# Patient Record
Sex: Male | Born: 1975 | Race: White | Hispanic: No | Marital: Married | Smoking: Never smoker
Health system: Southern US, Community
[De-identification: ages and names within clinical notes are randomized; demographics above are authoritative.]

## PROBLEM LIST (undated history)

## (undated) DIAGNOSIS — I4949 Other premature depolarization: Secondary | ICD-10-CM

## (undated) DIAGNOSIS — I429 Cardiomyopathy, unspecified: Secondary | ICD-10-CM

## (undated) HISTORY — DX: Cardiomyopathy, unspecified: I42.9

## (undated) HISTORY — DX: Other premature depolarization: I49.49

---

## 2001-04-11 ENCOUNTER — Emergency Department (HOSPITAL_COMMUNITY): Admission: EM | Admit: 2001-04-11 | Discharge: 2001-04-11 | Payer: Self-pay | Admitting: Emergency Medicine

## 2003-11-08 ENCOUNTER — Emergency Department (HOSPITAL_COMMUNITY): Admission: EM | Admit: 2003-11-08 | Discharge: 2003-11-08 | Payer: Self-pay | Admitting: Emergency Medicine

## 2004-04-02 ENCOUNTER — Encounter: Payer: Self-pay | Admitting: Cardiology

## 2004-04-02 ENCOUNTER — Emergency Department (HOSPITAL_COMMUNITY): Admission: EM | Admit: 2004-04-02 | Discharge: 2004-04-02 | Payer: Self-pay

## 2004-04-03 ENCOUNTER — Encounter: Payer: Self-pay | Admitting: Cardiology

## 2004-04-03 ENCOUNTER — Inpatient Hospital Stay (HOSPITAL_COMMUNITY): Admission: EM | Admit: 2004-04-03 | Discharge: 2004-04-08 | Payer: Self-pay | Admitting: Emergency Medicine

## 2004-04-06 ENCOUNTER — Encounter: Payer: Self-pay | Admitting: Cardiology

## 2005-06-23 ENCOUNTER — Emergency Department (HOSPITAL_COMMUNITY): Admission: EM | Admit: 2005-06-23 | Discharge: 2005-06-23 | Payer: Self-pay | Admitting: Emergency Medicine

## 2005-07-13 ENCOUNTER — Ambulatory Visit: Payer: Self-pay | Admitting: Cardiology

## 2005-07-20 ENCOUNTER — Ambulatory Visit: Payer: Self-pay

## 2005-10-05 ENCOUNTER — Ambulatory Visit: Payer: Self-pay | Admitting: Cardiology

## 2005-12-07 ENCOUNTER — Emergency Department (HOSPITAL_COMMUNITY): Admission: EM | Admit: 2005-12-07 | Discharge: 2005-12-07 | Payer: Self-pay | Admitting: Emergency Medicine

## 2006-10-24 IMAGING — CT CT PELVIS W/O CM
2 of 4 series · 17 of 46 positions shown, 19 images · IV contrast (agent unspecified)
Comparison: No previous films for comparison.

CLINICAL DATA: Left groin and back pain for one day.  Nausea.  No hematuria.  No previous abdominal surgery of history of carcinoma.  
 ABDOMEN CT WITHOUT CONTRAST:
TECHNIQUE: Multidetector CT imaging of the abdomen was performed following the standard protocol without IV contrast.
TECHNIQUE: Multidetector CT imaging of the pelvis was performed following the standard protocol without IV contrast.

[Series 2: abd/pelv w/o 5.0 b31f st · axial · non-contrast · 0.78mm/px · z∈[-528,-108]mm · 14 of 92 slices shown, 16 images]
[im 4/92  soft-tissue]
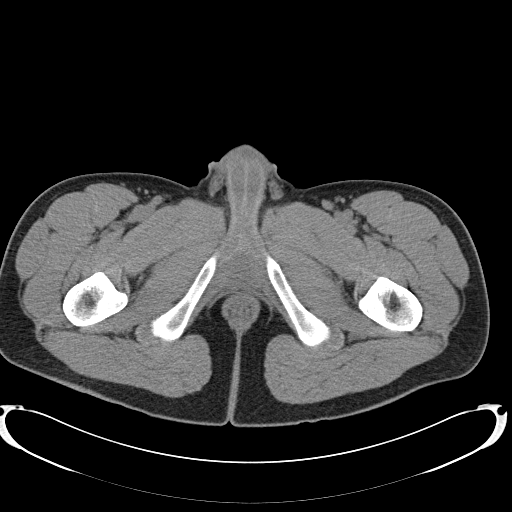
[im 4/92  bone]
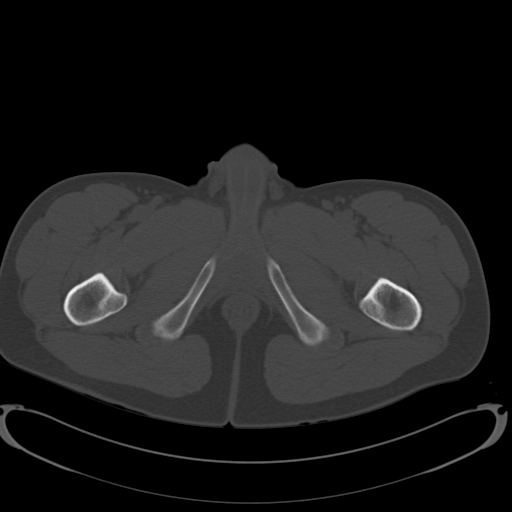
[im 12/92  soft-tissue]
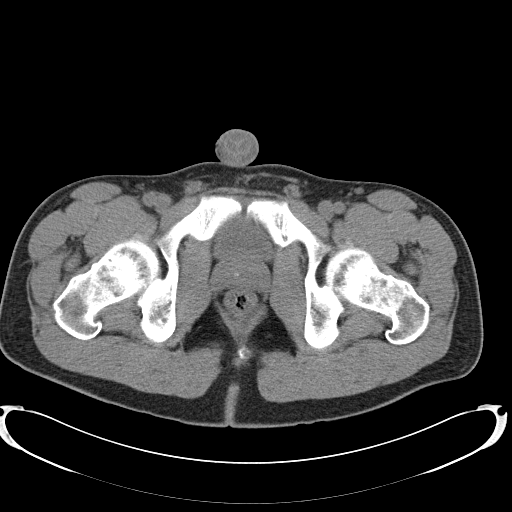
[im 19/92  soft-tissue]
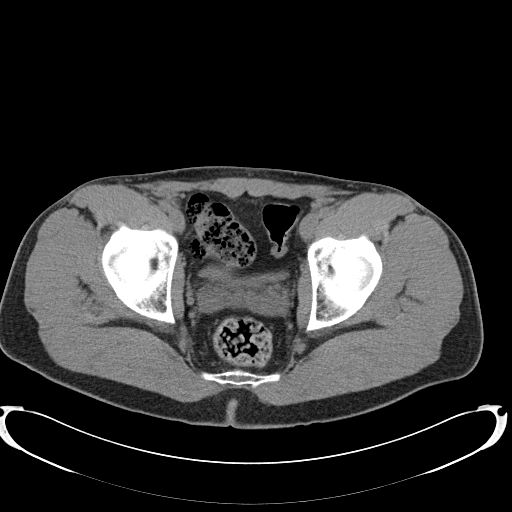
[im 23/92  soft-tissue]
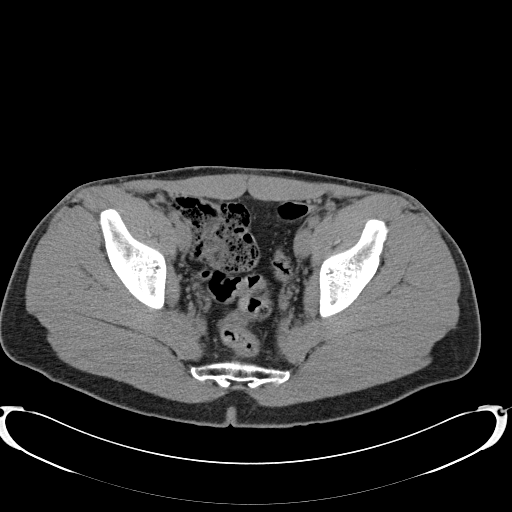
[im 31/92  soft-tissue]
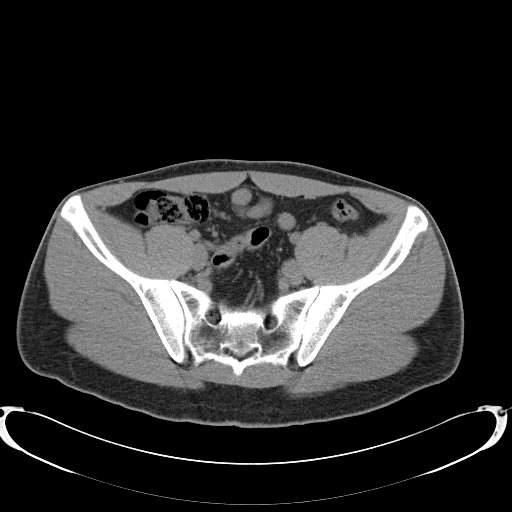
[im 38/92  soft-tissue]
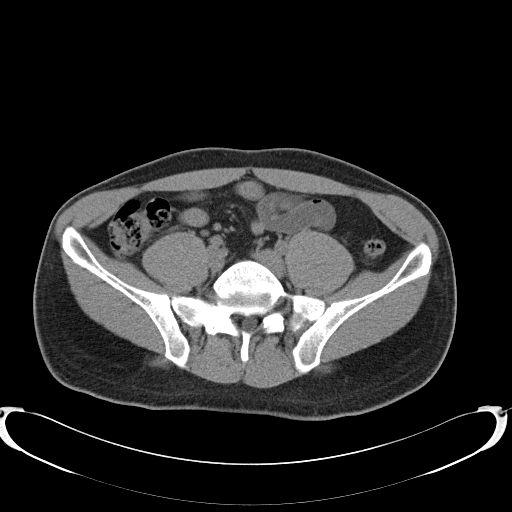
[im 42/92  soft-tissue]
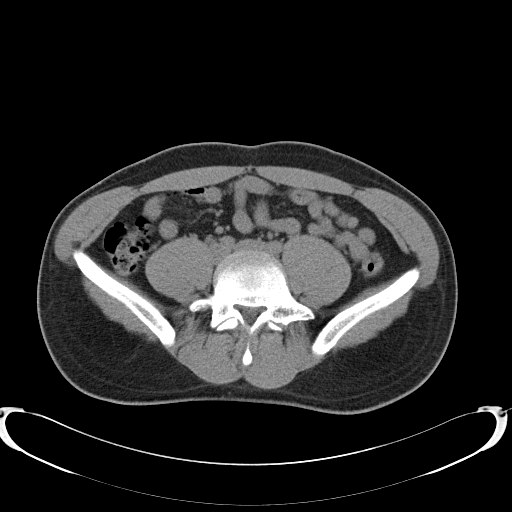
[im 50/92  soft-tissue]
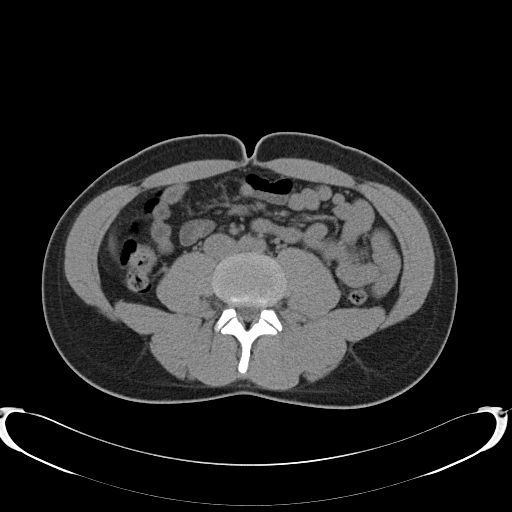
[im 54/92  soft-tissue]
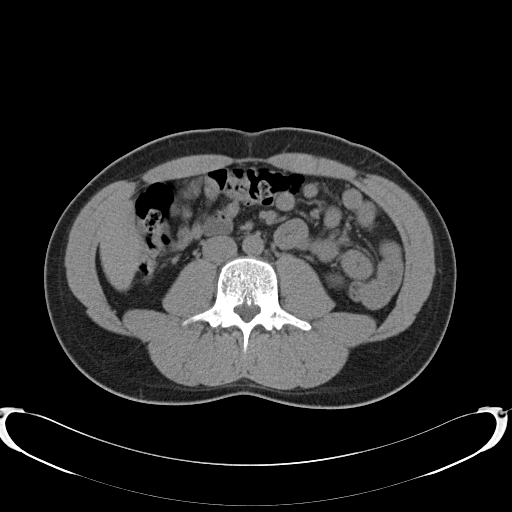
[im 54/92  bone]
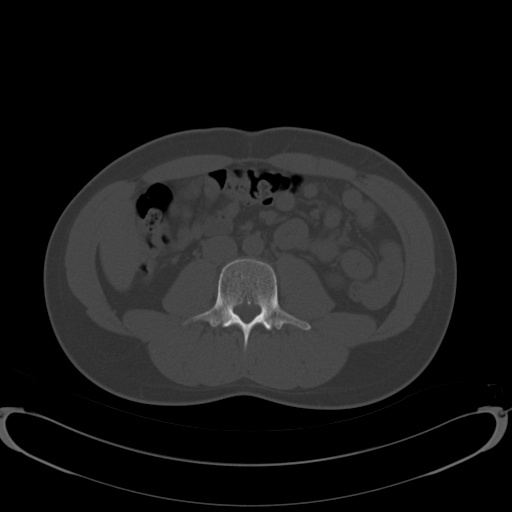
[im 61/92  soft-tissue]
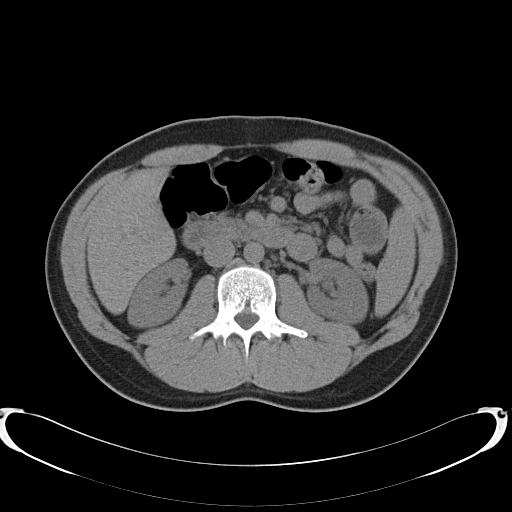
[im 69/92  soft-tissue]
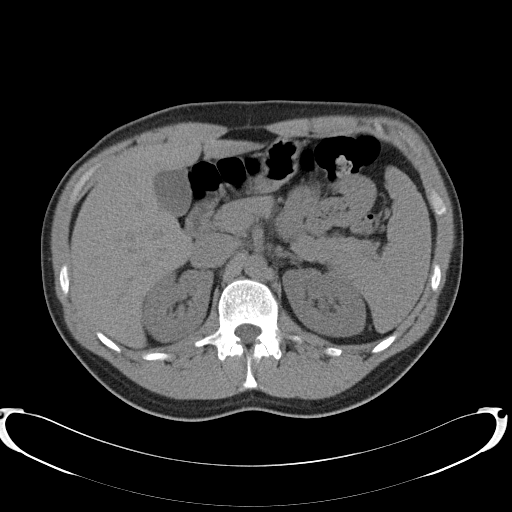
[im 73/92  soft-tissue]
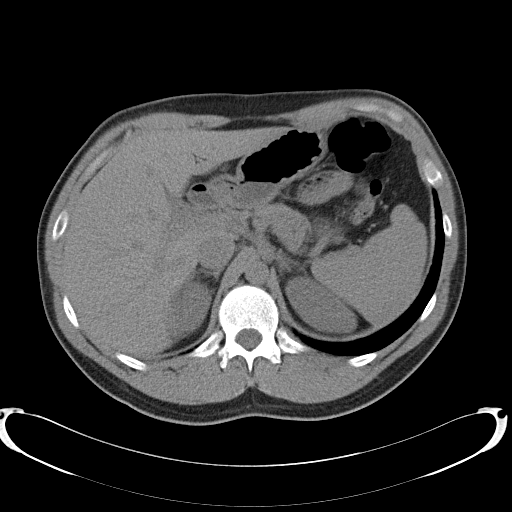
[im 80/92  soft-tissue]
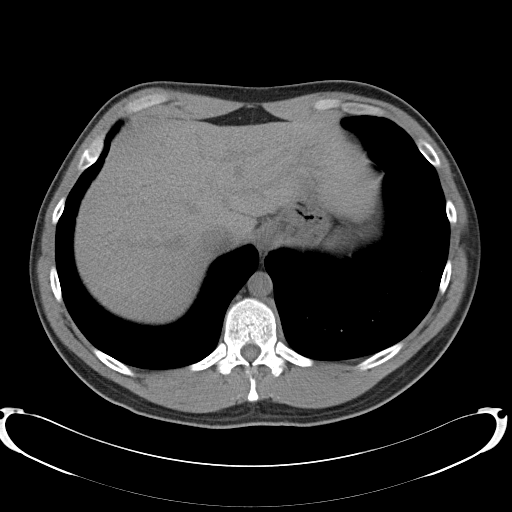
[im 88/92  soft-tissue]
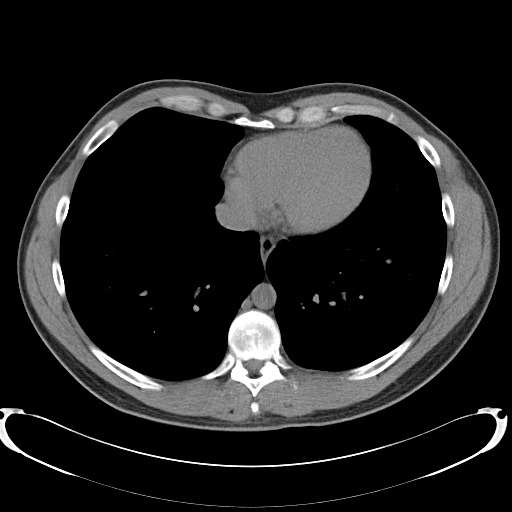

[Series 602: cor thins abd/pel · coronal · 0.89mm/px · 3 of 50 slices shown]
[im 17/50  soft-tissue]
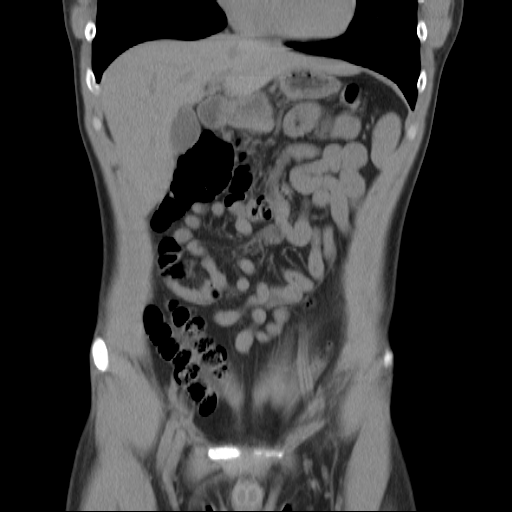
[im 22/50  soft-tissue]
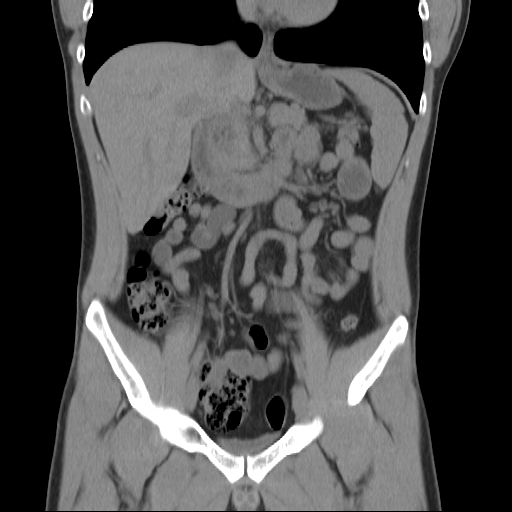
[im 28/50  soft-tissue]
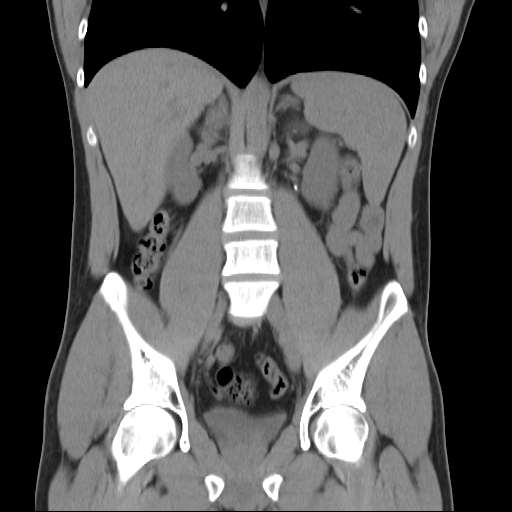

[17 of 46 positions shown; findings below may reference images not displayed]

FINDINGS: Scans show mild dilatation of the left renal collecting system and there is a partially obstructing 2 x 3 mm calculus in the region of the left ureteropelvic junction.  There are no stones seen within either kidney.  The lower lung fields, gallbladder, liver, common bile duct, pancreas, and spleen are normal.  The adrenal glands are normal.  There is no free fluid or air within the abdomen.  The bowel is not obstructed.  The bones of the lower thorax and upper lumbar spine are normal.
IMPRESSION: Partially obstructing 2 x 3 mm calculus left ureteropelvic junction.  Otherwise normal CT scan of the abdomen without contrast. 
 PELVIS CT WITHOUT CONTRAST:
FINDINGS: Scans show no evidence of obstruction of the distal ureters.  The bladder, prostate, and seminal vesicles are normal except for a single small calcification associated with the right prostate.  No free fluid or air is seen within the pelvis.  There is no obstruction of the bowel. There is a moderate amount of fecal material in the cecum.  The appendix is retrocecal in position and appears normal.  The bones of the pelvis are normal.  There are no significant changes associated with the lower lumbar spine.
IMPRESSION: Normal CT scan of the pelvis without contrast.

## 2007-01-13 ENCOUNTER — Ambulatory Visit: Payer: Self-pay | Admitting: Cardiology

## 2007-01-18 ENCOUNTER — Encounter: Payer: Self-pay | Admitting: Cardiology

## 2007-02-03 ENCOUNTER — Ambulatory Visit: Payer: Self-pay | Admitting: Internal Medicine

## 2007-05-18 ENCOUNTER — Ambulatory Visit: Payer: Self-pay | Admitting: Cardiology

## 2008-03-18 ENCOUNTER — Ambulatory Visit: Payer: Self-pay | Admitting: Cardiology

## 2009-01-31 ENCOUNTER — Ambulatory Visit: Payer: Self-pay | Admitting: Cardiology

## 2009-07-02 DIAGNOSIS — I429 Cardiomyopathy, unspecified: Secondary | ICD-10-CM

## 2009-07-02 DIAGNOSIS — I428 Other cardiomyopathies: Secondary | ICD-10-CM | POA: Insufficient documentation

## 2009-07-02 DIAGNOSIS — I4949 Other premature depolarization: Secondary | ICD-10-CM | POA: Insufficient documentation

## 2010-12-08 NOTE — Letter (Signed)
Summary: Discharge Summary  Discharge Summary   Imported By: Dorise Hiss 06/17/2010 11:31:28  _____________________________________________________________________  External Attachment:    Type:   Image     Comment:   External Document

## 2010-12-08 NOTE — Consult Note (Signed)
Summary: Consultation Report  Consultation Report   Imported By: Dorise Hiss 06/17/2010 11:34:02  _____________________________________________________________________  External Attachment:    Type:   Image     Comment:   External Document

## 2010-12-08 NOTE — Cardiovascular Report (Signed)
Summary: Cardiac Catheterization  Cardiac Catheterization   Imported By: Dorise Hiss 06/17/2010 11:28:05  _____________________________________________________________________  External Attachment:    Type:   Image     Comment:   External Document

## 2011-03-23 NOTE — Assessment & Plan Note (Signed)
[facility]                          [facility] NOTE   Richard Gardner, Richard Gardner                          MRN:          [id number]  DATE:03/18/2008                            DOB:          03-05-1976    HISTORY OF PRESENT ILLNESS:  The patient is a 35 year old male with  history of nonischemic cardiomyopathy, mild LV dysfunction.  The patient  is doing quite well.  He denies any chest pain, shortness of breath.  He  has gained weight and is less active.  He denies any palpitations or  syncope.   MEDICATIONS:  Acebutolol 20 mg p.o. daily.   PHYSICAL EXAMINATION:  VITAL SIGNS:  Blood pressure 106/73, heart rate  64, weight 195 pounds.  NECK:  Normal carotid upstroke, no carotid bruits.  LUNGS:  Clear.  HEART:  Regular rate and rhythm.  Normal S1 and S2.  No murmurs, rubs,  or gallops.  ABDOMEN:  Soft, nontender, no rebound or guarding.  Good bowel sounds.  EXTREMITIES:  No cyanosis, clubbing or edema.  NEURO:  Patient alert, oriented and grossly nonfocal.   PROBLEM LIST:  1. Premature ventricular contraction, asymptomatic controlled with      Acebutolol.  2. History of nonischemic cardiomyopathy and very mild left      ventricular dysfunction.  3. Status post exercise treadmill testing with no exercise-induced      ventricular arrhythmias.   PLAN:  EKG was reviewed in the office today.  It was within normal  limits, short of occasional PVCs but the patient did not take his  medications.  The patient will have a follow-up echocardiographic study  done in six months, but otherwise no change to his medical regimen.     Learta Codding, MD,FACC  Electronically Signed    GED/MedQ  DD: 03/18/2008  DT: 03/18/2008  Job #: [id number]

## 2011-03-23 NOTE — Assessment & Plan Note (Signed)
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  The patient's  last echocardiographic study demonstrated an EF of 40-45%.  This study  was done in 2008.  The patient actually has done well.  He has no  dyspnea, orthopnea, or PND.  He has no recurrent significant  palpitations on his acebutolol.  He remains active.  He works out quite  a bit.  He is under a lot of stress; however; his family is expecting a  second baby.  He also has problems with insomnia.   MEDICATIONS:  Acebutolol 200 mg p.o. daily.   PHYSICAL EXAMINATION:  VITAL SIGNS:  Blood pressure 125/76, heart rate  69, weight is 195 pounds.  NECK:  Normal carotid upstroke and no carotid bruits.  LUNGS:  Clear breath sounds bilaterally.  HEART:  Regular rate and rhythm.  Normal S1 and S2.  No murmurs,  rubs,  or gallops.  ABDOMEN:  Soft, nontender.  No rebound or guarding.  Good bowel sounds.  EXTREMITIES:  No cyanosis, clubbing, or edema.   A 12-lead electrocardiogram normal sinus rhythm with no acute ischemic  changes.   PROBLEMS:  1. History of premature ventricular contractions, asymptomatic unless      acebutolol.  2. History of nonischemic cardiomyopathy with an ejection fraction of      45%.  3. Status post exercise treadmill testing.  No induced exercise      ventricular arrhythmias.  4. The patient can continue his current medical regimen.  Next year,      we will obtain a repeat echocardiographic study.     Learta Codding, MD,FACC  Electronically Signed    GED/MedQ  DD: 01/31/2009  DT: 02/01/2009  Job #: [id number]

## 2011-03-23 NOTE — Assessment & Plan Note (Signed)
[facility]                          [facility]   JENNIE, BOLAR                          MRN:          [id number]  DATE:05/18/2007                            DOB:          30-May-1976    No primary physician.   HISTORY OF PRESENT ILLNESS:  The patient is a 35 year old male with a  history of nonischemic cardiomyopathy with mild LV dysfunction.  The  patient has premature ventricular contractions felt to be originating  from the aortic outflow track.  He recently saw Dr. 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  Dr. Ladona Ridgel  also felt the patient's risk for sudden death was extraordinarily low  and did not need any further intervention.  He did recommend for him to  stay on beta blocker.  Unfortunately, the patient does have side effects  with beta blocker therapy and states that he feels very fatigued and  tired and wants to change his drug regimen.  He also does not know if  part of his problem is deconditioning or whether this could be related  to his mild LV dysfunction.  He is a very active individual, otherwise.  He does appear to get plenty of sleep.  He denies any chest pain,  orthopnea, or PND.   MEDICATIONS:  Toprol 25 mg p.o. daily.   PHYSICAL EXAMINATION:  VITAL SIGNS:  Blood pressure 130/73, heart rate  78, weight is 184 pounds.  NECK EXAM:  Normal carotid upstroke, no carotid bruit.  LUNGS:  Clear breath sounds bilaterally.  HEART:  Regular rate and rhythm, normal S1, S2, no murmurs, rubs, or  gallops.  ABDOMEN:  Soft, nontender, no rebound or guarding.  Good bowel sounds.  EXTREMITY EXAM:  No cyanosis, clubbing, or edema.  NEURO:  Patient alert and oriented, grossly nonfocal.   PROBLEM LIST:  1. Premature ventricular contractions, asymptomatic.  2. History of nonischemic cardiomyopathy, mild left ventricular      dysfunction.  3. Status post recent exercise treadmill test  with no exercise-induced      ventricular arrhythmias.  4. Rule out deconditioning.   PLAN:  1. We will change the patient's beta blocker to acebutolol 200 mg a      day hoping that he will have less side effects secondary to this      medication.  2. I have proposed to the patient that we would proceed with CPX      testing if changing beta      blockers does not appear to improve his clinical condition.  I am      somewhat convinced that      the patient could not have a significant component of      deconditioning that is contributing to his feeling of fatigue.     Learta Codding, MD,FACC  Electronically Signed    GED/MedQ  DD: 05/18/2007  DT: 05/19/2007  Job #: [id number]

## 2011-03-26 NOTE — Assessment & Plan Note (Signed)
[facility]                          [facility]   Richard Gardner, Richard Gardner                          MRN:          [id number]  DATE:01/13/2007                            DOB:          16-Feb-1976    HISTORY OF PRESENT ILLNESS:  The patient is a 35 year old male with a  history of nonsustained ventricular tachycardia.  The patient was felt  several years ago to have had a cardiomyopathy.  He was referred for an  evaluation at [facility].  He was found to at that time to have  dense left ventricular ectopy.  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  The patient's most  recent ejection fraction was normalized.  His initial ejection fraction  was found to be around 30-35%.  The patient has a history of many years  of ventricular ectopy.  He has been treated with beta blocker therapy in  the past for his ectopy.  He had also had a MRI performed to chart  normal ejection fraction at 53%, no evidence of right ventricular  dysplasia (no definite fatty infiltration, appears consistent with  diagnosis).  When the patient was last seen in 2006, he actually was  doing quite well.  He has had no significant recurrent ventricular  arrhythmias.   The patient states that more lately he has been feeling very fatigued  and tired.  Also at work he feels episodes where he feels somewhat  presyncopal.  He stated that he feels like his heart stops and then  resets.  This has been going on now for several months.  He has no  evidence of substernal chest pressure or shortness of breath.  He does  describe results of lightheadedness.  He usually tries to lie down.  He  feels frequent PVCs.  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   It is clear, however, from the history the  patient has more ectopy then  he described only last time in 2006.   MEDICATIONS:  Currently none.   PHYSICAL EXAMINATION:  VITAL SIGNS:  Blood pressure 136/69, heart rate  75.  Weight is 185 pounds.  GENERAL:  A well-nourished white male in no apparent distress.  HEENT:  Pupils, sclerae and conjunctivae clear.  Neck supple.  No  carotid bruits.  LUNGS:  Clear breath sounds bilaterally.  HEART:  Regular rate and rhythm with frequent ectopy.  ABDOMEN:  Soft and nontender.  No rebound or guarding.  Good bowel  sounds.  EXTREMITIES:  No clubbing, cyanosis, or edema.  NEUROLOGICAL:  The patient is alert and oriented, grossly nonfocal.   PROBLEM LIST:  1. Cardiomyopathy with secondary arrhythmias.  2. Nonsustained ventricular tachycardia secondary to #1.  3. Negative cardiac MRI for fatty infiltration.  4. Recurrent palpitations.  5. Premature ventricular contractions.  6. 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   PLAN:  1. The patient will be referred for a CardioNet monitor.  2. The patient will also have an echocardiographic study done to      reassess his ejection fraction.  3. The patient will be started on low-dose beta blocker at therapy.  I      anticipate that after review of the Holter monitor and      echocardiogram the patient may be referred for second opinion at      [facility] when we referred him for a second      opinion.     Learta Codding, MD,FACC  Electronically Signed    GED/MedQ  DD: 01/15/2007  DT: 01/16/2007  Job #: [id number]

## 2011-03-26 NOTE — Discharge Summary (Signed)
NAME:  Richard Gardner, Richard Gardner                           ACCOUNT NO.:  [id number]   MEDICAL RECORD NO.:  [id number]                   PATIENT TYPE:  INP   LOCATION:  2033                                 FACILITY:  [facility]   PHYSICIAN:  Learta Codding, M.D. [facility]             DATE OF BIRTH:  08-Jun-1976   DATE OF ADMISSION:  04/03/2004  DATE OF DISCHARGE:                           DISCHARGE SUMMARY - REFERRING   ADDENDUM:  Follow-up arrangements were changed at time of discharge.  The  patient will present tomorrow, April 09, 2004, for routine treadmill testing  with Dr. Lewayne Bunting.  He will then follow up in the office with Duke Salvia, M.D., on April 23, 2004, at 9:30 a.m. for electrophysiology  evaluation.      Gene Serpe, P.A. [facility]                      Learta Codding, M.D. [facility]    GS/MEDQ  D:  04/08/2004  T:  04/08/2004  Job:  [id number]
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
## 2011-03-26 NOTE — Procedures (Signed)
[place] [facility]                              EXERCISE TREADMILL   RUNE, MENDEZ                          MRN:          [id number]  DATE:02/03/2007                            DOB:          Sep 21, 1976    INDICATION FOR TREADMILL TEST:  Evaluation of a patient with frequent  PVCs for exercise-induced PVCs.   1. Introduction.  The patient is a very pleasant 36 year old man with      a long history of PVCs.  He has mild LV dysfunction with an EF that      has varied between 35 and 50% in the past.  Most recent 2D echo      demonstrated an EF of 40-45%.  The patient has been under      increasing stress lately and has felt he has had more palpitations.      He is now scheduled for exercise treadmill testing to see if      exercise-induced arrhythmias can be observed.   1. Procedure.  After informed consent was obtained the patient was      prepped in the usual manner and placed on the exercise treadmill.      His initial blood pressure was 110/64, his heart rate was 80.  He      subsequently exercised through 5 stages of a Bruce protocol.      Initial heart rate with exercise was 90 b.p.m., which gradually      increased up to 160 beats a minute.  The blood pressure was      initially 120/60, during exercise it increased up to 200/63.      During exercise there were no ischemic EKG changes.  The patient      had occasional PVCs, sometimes in a quadrigeminal fashion, but      there were no couplets, no triplets and no nonsustained VT.  At the      end of 15 minutes of exercise, the patient had reached his target      heart rate and the decision was made to discontinue exercise.  He      was allowed to recover in the usual manner.   1. Complications.  There were no immediate complications.   1. Results.  This demonstrates a negative exercise test for inducible      arrhythmias.  It also demonstrates a very good functional capacity      in a patient with a  history of mild LV dysfunction.     Doylene Canning. Ladona Ridgel, MD  Electronically Signed    GWT/MedQ  DD: 02/03/2007  DT: 02/03/2007  Job #: [id number]   cc:   Learta Codding, MD,FACC

## 2011-03-26 NOTE — Cardiovascular Report (Signed)
NAME:  Richard Gardner, Richard Gardner                           ACCOUNT NO.:  [id number]   MEDICAL RECORD NO.:  [id number]                   PATIENT TYPE:  INP   LOCATION:  2033                                 FACILITY:  [facility]   PHYSICIAN:  Jonelle Sidle, M.D. [facility]        DATE OF BIRTH:  1975/11/10   DATE OF PROCEDURE:  DATE OF DISCHARGE:                              CARDIAC CATHETERIZATION   CARDIOLOGIST:  Olga Millers, M.D.   INDICATIONS:  Mr. Minkin is a 35 year old male admitted with a history of  palpitations with documented premature ventricular complexes and  nonsustained ventricular tachycardia by telemetry and Holter monitoring.  He  has had presyncope that has been exertional, and outpatient echocardiogram  showed an ejection fraction reportedly in the 30-35% range.  The patient is  now referred for diagnostic coronary angiography to assess coronary anatomy  and hemodynamics.   PROCEDURES PERFORMED:  1. Left heart catheterization.  2. Right heart catheterization.  3. Selective coronary angiography.  4. Left ventriculography.   ACCESS AND EQUIPMENT:  The area about the right femoral artery and vein was  anesthetized with 1% lidocaine and a 6 French sheath was placed in the right  femoral artery via the modified Seldinger technique, followed by an 8 French  sheath placed in the right femoral vein.  A standard 7.5 French balloon-  tipped flow-directed catheter was used for right heart catheterization and  hemodynamic assessment.  Preformed 6 [place] and JR4 catheters were used  for selective coronary angiography and a 6 French angled pigtail catheter  was used for left heart catheterization and left ventriculography.  Exchanges were made over a wire.  The patient tolerated the procedure well  without immediate complications.   HEMODYNAMICS:  1. Right atrium:  Mean of 8.  2. Right ventricle:  25/8.  3. Pulmonary artery:  22/11.  4. Pulmonary capillary wedge pressure mean of  12.  5. Left ventricle:  100/11.  6. Aorta:  99/63.  7. Cardiac output 6.0 by the Fick method, cardiac index 3.0 by the Fick     method.  8. Arterial saturation 97% on room air.  9. Pulmonary artery saturation 77%.   ANGIOGRAPHIC FINDINGS:  1. The left main coronary artery is free of significant flow-limiting     coronary atherosclerosis.  2. The left anterior descending is a medium-caliber vessel with a large     proximal diagonal branch and smaller distal diagonal branch.  Minor     luminal irregularities are noted.  3. The circumflex coronary artery is a large vessel with three obtuse     marginal branches, the first of which is essentially a ramus intermedius.     No significant flow-limiting coronary atherosclerosis is noted.  4. The right coronary artery is large and dominant.  Minor luminal     irregularities are noted.   Left ventriculography was performed in the RAO and revealed an ejection  fraction calculated  at 47% in the setting of premature ventricular  complexes.  No significant mitral regurgitation was noted.   DIAGNOSES:  1. No significant flow-limiting coronary atherosclerosis in the major     epicardial vessels as described.  2. Left ventricular ejection fraction calculated at 47% with no significant     mitral regurgitation in the setting of ventricular ectopy.  3. Normal left ventricular end-diastolic pressure of 11 with normal cardiac     index of 3.0.   RECOMMENDATIONS:  At this point will plan further electrophysiology  consultation.                                               Jonelle Sidle, M.D. [facility]    SGM/MEDQ  D:  04/06/2004  T:  04/06/2004  Job:  [id number]   cc:   Olga Millers, M.D. [facility]

## 2011-03-26 NOTE — H&P (Signed)
NAME:  Richard Gardner, Richard Gardner                           ACCOUNT NO.:  [id number]   MEDICAL RECORD NO.:  [id number]                   PATIENT TYPE:  INP   LOCATION:  1843                                 FACILITY:  [facility]   PHYSICIAN:  Learta Codding, M.D. [facility]             DATE OF BIRTH:  Sep 23, 1976   DATE OF ADMISSION:  04/03/2004  DATE OF DISCHARGE:                                HISTORY & PHYSICAL   PHYSICIANS:  1. Laurita Quint, M.D., referring physician.  2. Learta Codding, M.D., cardiologist (new).   CHIEF COMPLAINT:  Palpitations and substernal chest pain associated with  frequent PVCs and longstanding ventricular tachycardia by Holter monitor.   HISTORY OF PRESENT ILLNESS:  Richard Gardner is a 35 year old male who works  locally as a Theme park manager.  He took several years ago the agility  test for the [employer].  The patient also works  frequently with EMS.  The patient was referred from our office to the  emergency room after a Holter monitor that was applied yesterday was found  to demonstrate frequent PVCs, bigeminy, trigeminy, and occasional runs of  nonsustained ventricular tachycardia.  The patient's history dates back to  approximately eight years ago when he first noticed palpitations.  He stated  that they usually occurred under exertion or worsened by caffeinated  beverages.  He had apparently a workup done by our practice with an exercise  treadmill test.  An echocardiogram was told that this was normal.  He never  had any associated complications, although three years ago when playing in  the gym he had a presyncopal episode on exertion.  He felt that this was  related to eating prior to this activity.  However, over the last few weeks,  the patient has noticed some decrease in exercise tolerance where he states  that it sometimes feel harder to breathe.  He also has noted some marked  increased in palpitations that are now associated with left-sided  chest  pain.  He has taken an occasional Claritin-D but none recently.  He does  also feel that caffeine does worsen his palpitations.   Due to the frequency of his palpitations, he placed himself on telemetry by  EMS and noticed that he had frequent ectopy. He then requested the Holter  monitor with results as outlined above.  The patient is now being admitted  to the emergency room for further evaluation.   ALLERGIES:  Known seasonal allergies.   MEDICATIONS:  None.   PAST MEDICAL HISTORY:  1. Presyncope with exercise three years ago as outlined above.  2. Negative exercise treadmill and echocardiogram eight years ago for     palpitations.  3. Bedside echocardiography performed in the ER today, Apr 03, 2004, with an     ejection fraction of approximately 30-35% with global hypokinesis and     mild ventricular dilatation.  No definite  segmental wall motion     abnormalities and no significant valvular lesions.   SOCIAL HISTORY:  The patient lives in [place].  He is a IT sales professional. He  was recently exposed to viral meningitis.  The patient occasionally smokes  when going out.  He occasionally drinks alcohol, no more than a few beers a  day.  Denies any drug or other medication use.   FAMILY HISTORY:  Notable for congestive heart failure on the mother's side  but all problems with advanced age and lung disease on the father's side.  The father has been exposed to Agent Orange in the [place] War.   REVIEW OF SYMPTOMS:  Negative for fevers or chills.  Negative for headache  or sore throat.  Negative for rash or lesions.  Positive for chest pain and  shortness of breath on vigorous exertion.  No orthopnea, PND.  No edema.  Positive for palpitations.  No frequency or dysuria. No weakness or  numbness.  No myalgia or arthralgia.  No nausea or vomiting.  No polyuria or  polydipsia.   PHYSICAL EXAMINATION:  VITAL SIGNS:  Blood pressure 131/53, heart rate is 94  beats per minute with  frequent PVCs, respirations are 19.  GENERAL:  Well-nourished white male in no acute distress.  HEENT:  Normocephalic and atraumatic.  PERRLA.  EOMI.  NECK:  Supple.  No carotid upstroke. No carotid bruits.  No JVD.  HEART:  Regular rate and rhythm.  Normal S1 and S2.  No S3 or S4.  PMI does  not appear to be displaced.  LUNGS:  Clear breath sounds bilaterally.  SKIN:  No rash or lesions.  ABDOMEN:  Soft and nontender.  No rebound or guarding.  RECTAL:  Deferred.  GENITOURINARY:  Deferred.  EXTREMITIES:  No clubbing, cyanosis, or edema.  There are 2+ peripheral  pulses.  Dorsalis pedis, posterior tibial, and normal femoral pulses with no  bruits.  NEUROLOGICAL:  The patient is alert, oriented, and grossly nonfocal.   LABORATORY DATA:  Chest x-ray is pending.  EKG shows normal sinus rhythm  with frequent ectopy.  The ectopy appears to have a left bundle branch  morphology with inferior access.  Labs are currently pending.   IMPRESSION:  1. Nonsustained ventricular tachycardia:  The patient has frequency ectopy     and nonsustained ventricular tachycardia by Holter monitor.  His VE/1000     is 170 or 17%.  The patient reports no recent presyncope or syncope.  The     etiology of his ectopy is not clear but he does appear to have an     underlying cardiomyopathy with an ejection fraction of 30-35%.  The     latter was somewhat difficult to determine with the bedside     echocardiogram, particularly with the frequent ectopy.  It appears that     the focus of the ectopy may come from right ventricular outflow tract.     It is not clear if ectopy is the cause of the cardiomyopathy or there is     an underlying etiology to his cardiomyopathy.  I have discussed the case     with Dr. Duke Salvia and it is felt that we should try to abolish the     ectopy with beta blockers and also will be started on ACE inhibitor.  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  A question is whether this patient should     receive an EP study with ablation or whether he can be treated medically     for now with reassessment of his ejection fraction the next couple of     months.  Dr. Duke Salvia will see the patient on Monday and will     further discuss the need of possible EP study, particularly in light of     his prior exercise-induced syncope.  2. Cardiomyopathy:  Etiology is not clear.  TSH and hepatitis serologies as     well as human immunodeficiency virus serologies will be obtained.  The     plan is also to proceed with cardiac catheterization, left and right     heart on Monday.  The study is likely to be negative but full workup is     indicated at this point.   DISPOSITION:  The patient will be kept over the weekend.  We will try to  abolish his ectopy with beta blockers and assess on a daily basis his ectopy  count.  The plans are also to proceed to a left and right heart  catheterization on Monday and have further evaluation by EP service.                                                Learta Codding, M.D. [facility]    GED/MEDQ  D:  04/03/2004  T:  04/03/2004  Job:  [id number]

## 2011-03-26 NOTE — Discharge Summary (Signed)
NAME:  Richard Gardner, Richard Gardner                           ACCOUNT NO.:  [id number]   MEDICAL RECORD NO.:  [id number]                   PATIENT TYPE:  INP   LOCATION:  2033                                 FACILITY:  [facility]   PHYSICIAN:  Learta Codding, M.D. [facility]             DATE OF BIRTH:  1976/09/10   DATE OF ADMISSION:  04/03/2004  DATE OF DISCHARGE:  04/08/2004                           DISCHARGE SUMMARY - REFERRING   PROCEDURE:  Cardiac catheterization, Apr 06, 2004.   REASON FOR ADMISSION:  Please refer to Dr. Margarita Mail dictated admission note  for full details.   LABORATORY DATA:  Normal electrolytes with potassium 3.8-4.1.  Normal renal  function.  Normal cardiac markers.  BNP 44.  TSH 2.4.  Negative  hepatitis/HIV screen.  Chest x-ray:  No acute changes.   HOSPITAL COURSE:  Following presentation to the emergency room for  evaluation of symptomatic tachy palpitations and chest pain by Dr. Andee Lineman,  the patient ruled out for myocardial ischemia with normal serial cardiac  markers.   Continuous monitoring revealed frequent ventricular ectopy with single PVCs,  but no sustained ventricular tachycardia.  Laboratory data unrevealing with  normal electrolytes and TSH.   Dr. Andee Lineman recommended proceeding with right/left cardiac catheterization  and this was performed on Monday, May 30 by Dr. Levin Bacon (see report for  full details).  No significant flow-limiting coronary artery disease was  noted.  Left ventricular function was mildly depressed (47%) with associated  PVCs.   The patient was then referred to Dr. Sharrell Ku who recommended continued  management with beta blocker and ACE inhibitor.  He also ordered a signal-  average EKG and cardiac MRI.  Cardiac MRI revealed no evidence of fatty  infiltrate.  Signal-average EKG was pending at the time of discharge.  Recommendation was to follow up with Dr. Sharrell Ku as an outpatient, for  formal ET evaluation.  Arrangements will also be  made for the patient to have a routine treadmill  test at time of followup with Dr. Ladona Ridgel.   At the time of discharge, Dr. Andee Lineman recommended down-titrating bisoprolol  to 5 mg daily.  He noted that the patient could not tolerate 10 mg daily.  Dr. Andee Lineman also recommends a reassessment of left ventricular function with  repeat echocardiogram in 4-6 weeks.   DISCHARGE MEDICATIONS:  1. Bisoprolol 5 mg daily.  2. Altace 2.5 mg daily.   DISCHARGE INSTRUCTIONS:  1. The patient is to refrain from any strenuous exertion or competitive     sports until further notice.  2. The patient is scheduled to follow up with Dr. Sharrell Ku with routine     treadmill testing on Wednesday, July 6, at 11:15 a.m.   DISCHARGE DIAGNOSES:  1. Cardiomyopathy.     A. Nonobstructive coronary artery disease.  Cardiac catheterization May        30.     B. Mild left  ventricular dysfunction (ejection fraction 47% by        catheterization).     C. Cardiac MRI:  Negative fatty infiltrate; ejection fraction 37%.  2. Symptomatic premature ventricular contractions, documented nonsustained     ventricular tachycardia.  3. History of presyncope associated with exercise.      Gene Serpe, P.A. [facility]                      Learta Codding, M.D. [facility]    GS/MEDQ  D:  04/08/2004  T:  04/08/2004  Job:  [id number]   cc:   Laurita Quint, M.D.  [address] [place]  [place] [address]  Fax: [phone]

## 2011-03-26 NOTE — Assessment & Plan Note (Signed)
[facility]                         ELECTROPHYSIOLOGY OFFICE NOTE   FRANCES, JOYNT                          MRN:          [id number]  DATE:02/03/2007                            DOB:          [birth year]    REFERRING PHYSICIAN:  Learta Codding, MD,FACC   Mr. Weatherly was referred today by Dr. Lewayne Bunting for evaluation of a  nonischemic myopathy, PVCs, and palpitations.  The patient is a very  pleasant 35 year old man who has a remote history of nonsustained VT and  very dense ventricular ectopy.  Initially when he was evaluated several  years ago, his EF was 35%.  The patient was placed on beta blocker  therapy.  He was initially considered for ablation, but it was felt like  his ventricular ectopy was not sufficient enough to warrant this, and in  fact, his ventricular ectopy improved.  His repeat EF was in the 50s.  The patient was seen in the office by Dr. Andee Lineman back in March.  Prior  to this, he had noted some dizziness and fatigue, although it was  unclear whether this was related to his PVCs or not.  The patient had a  repeat echo done in [place], [place] he presently resides, and his EF was  thought to be 40-45%.  He has had no frank syncope.  He denies any  sensation of rapid heart racing.  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  The patient denies chest pain.  He denies  shortness of breath.  He has had no other symptoms.  He denies  peripheral edema.   Past medical history is really negative, except as noted in the HPI.  He  has a history of seasonal allergic rhinitis.   His family history is notable for both parents being alive and well.  He  has one brother who is also alive and well.   His medications include Toprol XL 25 mg a day.   His review of systems is noted in the HPI.  All systems reviewed and  found to be negative.   PHYSICAL EXAMINATION:  GENERAL:  He is a pleasant, well-appearing young  man in no acute  distress.  VITAL SIGNS:  Blood pressure today was 120/74.  Pulse was 80 and  regular.  Respirations were 18.  HEENT:  Normocephalic and atraumatic.  Pupils are equal and round.  The  oropharynx is moist.  Sclerae are anicteric.  NECK:  No jugular venous distention.  There is no thyromegaly.  Trachea  is midline.  The carotids are 2+ and symmetric.  LUNGS:  Clear bilaterally to auscultation.  There are no wheezes, rales  or rhonchi present.  CARDIOVASCULAR:  Regular rate and rhythm with normal S1 and S2.  I do  not appreciate any murmurs, rubs or gallops.  The PMI was not enlarged  nor was it laterally displaced on my exam.  ABDOMEN:  Soft, nontender, nondistended.  There is no organomegaly.  Bowel sounds are present.  There is no guarding or rebound.  EXTREMITIES:  No clubbing, cyanosis or  edema.  Pulses were 2+ and  symmetric.  NEUROLOGIC:  Alert and oriented x3.  Cranial nerves are intact.  Strength is 5/5 and symmetric.   EKG demonstrates a sinus rhythm with no PVCs.   IMPRESSION:  1. Frequent premature ventricular contractions and palpitations.  2. History of nonischemic cardiomyopathy with mild left ventricular      dysfunction.   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  My inclination is that he would be just fine and that his risk for  developing any sustained ventricular tachycardia is extraordinarily  small.  I will plan to have him undergo an exercise treadmill testing to  make sure that we cannot walk him into any ventricular tachycardia.  If  he has no exercise-induced arrhythmias, then I would recommend that he  be allowed to return to working as a Theatre stage manager without limitation.  He will need yearly follow-up echos to be sure that his LV function  remains stable.  Obviously, if he had syncope or other symptoms  consistent with arrhythmias, then we would reconsider  these  recommendations.     Doylene Canning. Ladona Ridgel, MD  Electronically Signed    GWT/MedQ  DD: 02/03/2007  DT: 02/03/2007  Job #: [id number]   cc:   Learta Codding, MD,FACC

## 2011-05-05 ENCOUNTER — Encounter: Payer: Self-pay | Admitting: Cardiology

## 2013-03-06 ENCOUNTER — Encounter: Payer: Self-pay | Admitting: Cardiology

## 2013-03-06 ENCOUNTER — Telehealth: Payer: Self-pay | Admitting: Cardiology

## 2013-03-06 ENCOUNTER — Ambulatory Visit (INDEPENDENT_AMBULATORY_CARE_PROVIDER_SITE_OTHER): Payer: BC Managed Care – PPO | Admitting: Cardiology

## 2013-03-06 VITALS — BP 140/82 | HR 80 | Ht 69.0 in | Wt 207.0 lb

## 2013-03-06 DIAGNOSIS — I428 Other cardiomyopathies: Secondary | ICD-10-CM

## 2013-03-06 DIAGNOSIS — I4949 Other premature depolarization: Secondary | ICD-10-CM

## 2013-03-06 DIAGNOSIS — I493 Ventricular premature depolarization: Secondary | ICD-10-CM

## 2013-03-06 DIAGNOSIS — I429 Cardiomyopathy, unspecified: Secondary | ICD-10-CM

## 2013-03-06 MED ORDER — METOPROLOL SUCCINATE ER 50 MG PO TB24: 50.0000 mg | ORAL_TABLET | Freq: Every day | ORAL | Status: DC

## 2013-03-06 NOTE — Progress Notes (Signed)
  HPI: pleasant male for evaluation of nonischemic cardiomyopathy. Previously seen by Dr. Andee Lineman. In 2005 the patient had a cardiac catheterization that showed nonobstructive coronary disease and an ejection fraction of 47%. Cardiac MRI showed no  RV dysplasia; EF 37. He has had problems with ventricular ectopy (question of cardiomyopathy caused his ectopy or ectopy caused his cardiomyopathy). Patient had exercise treadmill that showed no exercise induced ventricular arrhythmias. Patient seen by Dr. 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 Patient also states he was seen at [facility] and had an electrophysiology study for which I do not have the records. He did not have ablation. Last echocardiogram in 2008 showed an ejection fraction of 40-45% and mild left atrial enlargement. He has not been seen since 2010. Patient denies dyspnea, chest pain or syncope. He does note palpitations with his PVCs.   Current Outpatient Prescriptions  Medication Sig Dispense Refill  . chlorpheniramine (ALLERGY) 4 MG tablet Take 4 mg by mouth as needed.        . testosterone cypionate (DEPOTESTOTERONE CYPIONATE) 200 MG/ML injection once a week. One shot weekly      . metoprolol succinate (TOPROL-XL) 50 MG 24 hr tablet Take 1 tablet (50 mg total) by mouth daily. Take with or immediately following a meal.  90 tablet  3   No current facility-administered medications for this visit.    Not on File  Past Medical History  Diagnosis Date  . Other premature beats   . Secondary cardiomyopathy, unspecified     History reviewed. No pertinent past surgical history.  History   Social History  . Marital Status: Married    Spouse Name: N/A    Number of Children: 2  . Years of Education: N/A   Occupational History  .      Theatre stage manager   Social History Main Topics  . Smoking status: Former Games developer  . Smokeless tobacco: Not on file     Comment: socially   . Alcohol Use: Yes     Comment: socially   . Drug  Use: No  . Sexually Active: Not on file   Other Topics Concern  . Not on file   Social History Narrative   Full time. Regularly exercises.     Family History  Problem Relation Age of Onset  . Heart failure Other   . CAD Father     MI at age 88    ROS: no fevers or chills, productive cough, hemoptysis, dysphasia, odynophagia, melena, hematochezia, dysuria, hematuria, rash, seizure activity, orthopnea, PND, pedal edema, claudication. Remaining systems are negative.  Physical Exam:   Blood pressure 140/82, pulse 80, height 5\' 9"  (1.753 m), weight 207 lb (93.895 kg).  General:  Well developed/well nourished in NAD Skin warm/dry Patient not depressed No peripheral clubbing Back-normal HEENT-normal/normal eyelids Neck supple/normal carotid upstroke bilaterally; no bruits; no JVD; no thyromegaly chest - CTA/ normal expansion CV - RRR/normal S1 and S2; no murmurs, rubs or gallops;  PMI nondisplaced Abdomen -NT/ND, no HSM, no mass, + bowel sounds, no bruit 2+ femoral pulses, no bruits Ext-no edema, chords, 2+ DP Neuro-grossly nonfocal  ECG sinus rhythm at a rate of 80. PVCs noted. Normal axis. Inferior lateral T-wave inversion.

## 2013-03-06 NOTE — Assessment & Plan Note (Signed)
Patient has a history of nonischemic cardiomyopathy. I wonder if his cardiomyopathy may have been related to his ventricular ectopy. He continues to have PVCs both on his electrocardiogram and also symptomatically. I will add Toprol 50 mg daily. He did have some fatigue in the past with beta-blockade and we will need to follow this. Repeat echocardiogram to assess LV function. I will also schedule a 48-hour holter monitor to assess the amount of ectopy. 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 Previous exercise treadmill showed no exercise induced ventricular arrhythmias.

## 2013-03-06 NOTE — Telephone Encounter (Signed)
ROI faxed to [facility] @ [phone] 03/06/13/KM

## 2013-03-06 NOTE — Patient Instructions (Addendum)
Your physician recommends that you schedule a follow-up appointment in: 8 WEEKS WITH DR CRENSHAW  START METOPROLOL SUCC 50 MG ONCE DAILY  Your physician has requested that you have an echocardiogram. Echocardiography is a painless test that uses sound waves to create images of your heart. It provides your doctor with information about the size and shape of your heart and how well your heart's chambers and valves are working. This procedure takes approximately one hour. There are no restrictions for this procedure.   Your physician has recommended that you wear a 48 HOUR holter monitor. Holter monitors are medical devices that record the heart's electrical activity. Doctors most often use these monitors to diagnose arrhythmias. Arrhythmias are problems with the speed or rhythm of the heartbeat. The monitor is a small, portable device. You can wear one while you do your normal daily activities. This is usually used to diagnose what is causing palpitations/syncope (passing out).

## 2013-03-06 NOTE — Assessment & Plan Note (Signed)
Add Toprol 50 mg daily. 

## 2013-03-15 ENCOUNTER — Telehealth: Payer: Self-pay | Admitting: Cardiology

## 2013-03-15 NOTE — Telephone Encounter (Signed)
Records Rec From [facility], gave to [facility]  03/15/13/KM

## 2013-03-27 ENCOUNTER — Encounter: Payer: Self-pay | Admitting: *Deleted

## 2013-03-27 ENCOUNTER — Ambulatory Visit (HOSPITAL_COMMUNITY): Payer: BC Managed Care – PPO | Attending: Cardiovascular Disease | Admitting: Radiology

## 2013-03-27 DIAGNOSIS — I493 Ventricular premature depolarization: Secondary | ICD-10-CM

## 2013-03-27 DIAGNOSIS — I429 Cardiomyopathy, unspecified: Secondary | ICD-10-CM

## 2013-03-27 DIAGNOSIS — I428 Other cardiomyopathies: Secondary | ICD-10-CM

## 2013-03-27 NOTE — Progress Notes (Signed)
Patient ID: Richard Gardner, male   DOB: 12-18-1975, 37 y.o.   MRN: [id number] 48 Hour Holter monitor placed on patient.

## 2013-03-27 NOTE — Progress Notes (Signed)
Echocardiogram performed.  

## 2013-03-28 ENCOUNTER — Telehealth: Payer: Self-pay | Admitting: *Deleted

## 2013-03-28 MED ORDER — CARVEDILOL 6.25 MG PO TABS: 6.2500 mg | ORAL_TABLET | Freq: Two times a day (BID) | ORAL | Status: DC

## 2013-03-28 NOTE — Telephone Encounter (Signed)
DC metoprolol and try coreg 6.25 mg po BID Richard Gardner

## 2013-03-28 NOTE — Telephone Encounter (Signed)
Spoke with pt, Aware of dr crenshaw's recommendations.  °

## 2013-03-28 NOTE — Telephone Encounter (Signed)
Spoke with pt, aware of echo results. He is taking the metoprolol at night and wonders if there is anything else he can take. He is having fatigue and slow motion. Will forward for dr Jens Som review

## 2013-04-06 ENCOUNTER — Telehealth: Payer: Self-pay | Admitting: *Deleted

## 2013-04-06 NOTE — Telephone Encounter (Signed)
Left message for pt to call, monitor reviewed by dr Jens Som shows sinus with PVC's and rare couplet. % of PVC was 3.24.

## 2013-04-11 NOTE — Telephone Encounter (Signed)
Spoke with pt, aware of monitor results. 

## 2013-05-07 ENCOUNTER — Ambulatory Visit: Payer: BC Managed Care – PPO | Admitting: Cardiology

## 2013-05-10 ENCOUNTER — Telehealth: Payer: Self-pay | Admitting: Cardiology

## 2013-05-10 NOTE — Telephone Encounter (Signed)
New Problem:    Patient called in wanting to speak with you about having his records sent to another medical practice.  Please call back.

## 2013-05-10 NOTE — Telephone Encounter (Signed)
Left message for pt to call.

## 2013-05-14 ENCOUNTER — Encounter: Payer: Self-pay | Admitting: Cardiology

## 2013-05-14 ENCOUNTER — Ambulatory Visit (INDEPENDENT_AMBULATORY_CARE_PROVIDER_SITE_OTHER): Payer: BC Managed Care – PPO | Admitting: Cardiology

## 2013-05-14 VITALS — BP 120/81 | HR 70 | Ht 69.0 in | Wt 197.1 lb

## 2013-05-14 DIAGNOSIS — I429 Cardiomyopathy, unspecified: Secondary | ICD-10-CM

## 2013-05-14 DIAGNOSIS — I428 Other cardiomyopathies: Secondary | ICD-10-CM

## 2013-05-14 MED ORDER — CARVEDILOL 12.5 MG PO TABS: 12.5000 mg | ORAL_TABLET | Freq: Two times a day (BID) | ORAL | Status: DC

## 2013-05-14 NOTE — Telephone Encounter (Signed)
Discussed at office visit.

## 2013-05-14 NOTE — Patient Instructions (Signed)
Your physician wants you to follow-up in: 6 MONTHS WITH DR Jens Som You will receive a reminder letter in the mail two months in advance. If you don't receive a letter, please call our office to schedule the follow-up appointment.   INCREASE CARVEDILOL TO 12.5 MG ONE TABLET TWICE DAILY

## 2013-05-14 NOTE — Assessment & Plan Note (Signed)
An ejection fraction was 40-45% which is similar to previous. This may be related to frequent PVCs. Increase carvedilol to 12.5 mg by mouth twice a day. We will obtain records from [facility] concerning his previous EP study. We will consider referral to Dr. Ladona Ridgel who has seen him previously if there are any questions about the need for ablation or work restrictions.

## 2013-05-14 NOTE — Assessment & Plan Note (Signed)
Increase carvedilol to 12.5 twice a day.

## 2013-05-14 NOTE — Progress Notes (Signed)
   HPI: pleasant male for fu of nonischemic cardiomyopathy. In 2005 the patient had a cardiac catheterization that showed nonobstructive coronary disease and an ejection fraction of 47%. Cardiac MRI showed no RV dysplasia; EF 37. He has had problems with ventricular ectopy (question of cardiomyopathy caused his ectopy or ectopy caused his cardiomyopathy). Patient had exercise treadmill that showed no exercise induced ventricular arrhythmias. Patient seen by Dr. 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 Patient also states he was seen at [facility] and had an electrophysiology study for which I do not have the records. He did not have ablation. Echocardiogram repeated in may of 2014 and showed an ejection fraction of 40-45% and mild left atrial enlargement. Holter monitor in June 2014 showed sinus rhythm with frequent PVCs and occasional couplets. When I last saw him we added Toprol 50 mg daily. However this caused fatigue and we changed him to Coreg. Since then he denies dyspnea, chest pain or syncope.   Current Outpatient Prescriptions  Medication Sig Dispense Refill  . carvedilol (COREG) 6.25 MG tablet Take 1 tablet (6.25 mg total) by mouth 2 (two) times daily.  60 tablet  12  . chlorpheniramine (ALLERGY) 4 MG tablet Take 4 mg by mouth as needed.        . testosterone cypionate (DEPOTESTOTERONE CYPIONATE) 200 MG/ML injection once a week. One shot weekly       No current facility-administered medications for this visit.     Past Medical History  Diagnosis Date  . Other premature beats   . Secondary cardiomyopathy, unspecified     History reviewed. No pertinent past surgical history.  History   Social History  . Marital Status: Married    Spouse Name: N/A    Number of Children: 2  . Years of Education: N/A   Occupational History  .      Theatre stage manager   Social History Main Topics  . Smoking status: Former Games developer  . Smokeless tobacco: Not on file     Comment: socially   .  Alcohol Use: Yes     Comment: socially   . Drug Use: No  . Sexually Active: Not on file   Other Topics Concern  . Not on file   Social History Narrative   Full time. Regularly exercises.     ROS: no fevers or chills, productive cough, hemoptysis, dysphasia, odynophagia, melena, hematochezia, dysuria, hematuria, rash, seizure activity, orthopnea, PND, pedal edema, claudication. Remaining systems are negative.  Physical Exam: Well-developed well-nourished in no acute distress.  Skin is warm and dry.  HEENT is normal.  Neck is supple.  Chest is clear to auscultation with normal expansion.  Cardiovascular exam is regular rate and rhythm.  Abdominal exam nontender or distended. No masses palpated. Extremities show no edema. neuro grossly intact

## 2013-06-06 ENCOUNTER — Encounter: Payer: Self-pay | Admitting: Cardiology

## 2013-06-22 ENCOUNTER — Telehealth: Payer: Self-pay | Admitting: Cardiology

## 2013-06-22 NOTE — Telephone Encounter (Signed)
ROI faxed to [facility] at [phone] 06/22/13/km

## 2013-08-01 ENCOUNTER — Telehealth: Payer: Self-pay | Admitting: Cardiology

## 2013-08-01 DIAGNOSIS — I429 Cardiomyopathy, unspecified: Secondary | ICD-10-CM

## 2013-08-01 MED ORDER — CARVEDILOL 12.5 MG PO TABS
12.5000 mg | ORAL_TABLET | Freq: Two times a day (BID) | ORAL | Status: DC
Start: 2013-08-01 — End: 2014-10-11

## 2013-08-01 NOTE — Telephone Encounter (Signed)
Please have pt see dr Ladona Ridgel in fu  Prior to releasing for diving Richard Gardner

## 2013-08-01 NOTE — Telephone Encounter (Signed)
New Problem:  Pt states he needs a letter from Dr Jens Som... Stating he can dive... Please call for more details

## 2013-08-01 NOTE — Telephone Encounter (Signed)
Spoke with pt, he recently had a physical with the fire department. Their physician wants to know if the pt is clear to dive based on the echo he had this year. Will forward for dr Jens Som review

## 2013-08-10 NOTE — Telephone Encounter (Signed)
Left message for pt to call.

## 2013-08-10 NOTE — Telephone Encounter (Signed)
Follow Up ° °Pt returning call.  °

## 2013-08-10 NOTE — Telephone Encounter (Signed)
Spoke with pt, aware of dr Jens Som recommendations. Follow up scheduled with dr Ladona Ridgel

## 2013-08-16 ENCOUNTER — Ambulatory Visit (INDEPENDENT_AMBULATORY_CARE_PROVIDER_SITE_OTHER): Payer: BC Managed Care – PPO | Admitting: Internal Medicine

## 2013-08-16 ENCOUNTER — Encounter: Payer: Self-pay | Admitting: Internal Medicine

## 2013-08-16 VITALS — BP 120/78 | HR 71 | Ht 68.0 in | Wt 196.2 lb

## 2013-08-16 DIAGNOSIS — I493 Ventricular premature depolarization: Secondary | ICD-10-CM

## 2013-08-16 DIAGNOSIS — I429 Cardiomyopathy, unspecified: Secondary | ICD-10-CM

## 2013-08-16 DIAGNOSIS — I4949 Other premature depolarization: Secondary | ICD-10-CM

## 2013-08-16 NOTE — Patient Instructions (Signed)
Your physician recommends that you schedule a follow-up appointment as needed  Keep regular follow ups with Dr Jens Som

## 2013-08-19 NOTE — Assessment & Plan Note (Signed)
He is asymptomatic from this perspective. Will continue his beta blocker. He will need yearly echo's to follow his LV function.

## 2013-08-19 NOTE — Progress Notes (Signed)
      HPI [name] returns today after a long absence from our arrhythmia clinic. He is a pleasant 37 yo man with a non-ischemic, CM, class 1 CHF, and PVC's. When I saw him last several years ago, he was doing well. In the interim, he has had no chest pain, sob, or syncope. He continues to work full time without limitation. He is considering taking up diving. Remotely he had an attempted ablation at [facility] which was not successful. 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 He has no syncope or near syncope. A 2D echo obtained approximately 5 months ago demonstrated an EF of 45% without segmental wall motion abnormality.  No Known Allergies   Current Outpatient Prescriptions  Medication Sig Dispense Refill  . carvedilol (COREG) 12.5 MG tablet Take 1 tablet (12.5 mg total) by mouth 2 (two) times daily.  60 tablet  12  . chlorpheniramine (ALLERGY) 4 MG tablet Take 4 mg by mouth as needed.        . testosterone cypionate (DEPOTESTOTERONE CYPIONATE) 200 MG/ML injection once a week. One shot weekly       No current facility-administered medications for this visit.     Past Medical History  Diagnosis Date  . Other premature beats   . Secondary cardiomyopathy, unspecified     ROS:   All systems reviewed and negative except as noted in the HPI.   History reviewed. No pertinent past surgical history.   Family History  Problem Relation Age of Onset  . Heart failure Other   . CAD Father     MI at age 81     History   Social History  . Marital Status: Married    Spouse Name: N/A    Number of Children: 2  . Years of Education: N/A   Occupational History  .      Theatre stage manager   Social History Main Topics  . Smoking status: Former Games developer  . Smokeless tobacco: Not on file     Comment: socially   . Alcohol Use: Yes     Comment: socially   . Drug Use: No  . Sexual Activity: Not on file   Other Topics Concern  . Not on file   Social History Narrative   Full  time. Regularly exercises.      BP 120/78  Pulse 71  Ht 5\' 8"  (1.727 m)  Wt 196 lb 3.2 oz (88.996 kg)  BMI 29.84 kg/m2  Physical Exam:  Well appearing 37 yo man NAD HEENT: Unremarkable Neck:  No JVD, no thyromegally  Back:  No CVA tenderness Lungs:  Clear HEART:  Regular rate rhythm, no murmurs, no rubs, no clicks Abd:  soft, positive bowel sounds, no organomegally, no rebound, no guarding Ext:  2 plus pulses, no edema, no cyanosis, no clubbing Skin:  No rashes no nodules Neuro:  CN II through XII intact, motor grossly intact  EKG NSR  Assess/Plan:

## 2013-08-19 NOTE — Assessment & Plan Note (Signed)
The patient appears to have an ideopathic cardiomyopathy with class 1 symptoms. He will continue his beta blocker. I will defer the decision for an ACE inhibitor/ARB to Dr. Jens Som who is his primary cardiologist. There is no reason from my perspective that he cannot dive. No limitation to his activity from my perspective.

## 2013-08-31 ENCOUNTER — Telehealth: Payer: Self-pay | Admitting: Internal Medicine

## 2013-08-31 NOTE — Telephone Encounter (Signed)
Faxed to office

## 2013-08-31 NOTE — Telephone Encounter (Signed)
New Problem:  Pt states he is calling in reference about a letter that Dr. Ladona Ridgel was suppose to send to his work. Pt states it was a letter clearing him to go back to work on normal duty. Pt states he has been on medical probation at work. Please advise

## 2013-08-31 NOTE — Telephone Encounter (Signed)
Follow up  This is the fax # for the letter that is supposed to be faxed Dr. Williams Che.   Fax# [phone]

## 2013-08-31 NOTE — Telephone Encounter (Signed)
Left patient a message to return my call in regards to a fax number.  When he gives me the fax number I will be glad to fax Dr Lubertha Basque note to his work.

## 2013-09-07 ENCOUNTER — Telehealth: Payer: Self-pay | Admitting: Internal Medicine

## 2013-09-07 NOTE — Telephone Encounter (Signed)
OV Note laid on my Desk For Me To fax to Dr.Hubbard, looked up Dr.Hubbard he was Located at [facility]  I called spoke with Cheryl/[place] [employer]  she had no Pt there By this Name, called let Synetta Fail know she suggested I call Pt Called Pt # No Answer. I cannot Fax OV Note Until I have Correct Doctor/Confirmation of where Records are Going.

## 2013-09-07 NOTE — Telephone Encounter (Signed)
New Problem  Pt states he needs a call back ASAP!! 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 Pt states this is a very pressing matter. Please assist

## 2013-09-07 NOTE — Telephone Encounter (Signed)
Called stating he needed a letter for clearance for work.  Advised that Tresa Endo (Dr. Lubertha Basque nurse) had faxed that on 10/24.  States it was not received. States correct fax no. Is [phone].  Will fax today.

## 2013-09-13 ENCOUNTER — Other Ambulatory Visit: Payer: Self-pay

## 2014-10-11 ENCOUNTER — Other Ambulatory Visit: Payer: Self-pay

## 2014-10-11 DIAGNOSIS — I429 Cardiomyopathy, unspecified: Secondary | ICD-10-CM

## 2014-10-11 MED ORDER — CARVEDILOL 12.5 MG PO TABS: 12.5000 mg | ORAL_TABLET | Freq: Two times a day (BID) | ORAL | Status: DC

## 2015-02-14 ENCOUNTER — Telehealth: Payer: Self-pay | Admitting: Cardiology

## 2015-02-14 NOTE — Telephone Encounter (Signed)
Will need fu ov Olga Millers

## 2015-02-14 NOTE — Telephone Encounter (Signed)
Dr. Williams Che called, saw this patient in office.  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 If this is needed, whatever is adequate (Dr. Williams Che requests Dr. Ludwig Clarks recommendation on this)  He also requests clear statement from Dr. Jens Som on opinion, whether pt is at increased risk for incapacitation d/t history, as it pertains to duty/occupational demands.  Pt last seen by Dr. Jens Som 08/2013.  Will route for recommendation.  Dr. Williams Che can be reached by cell: [phone]     Fax: [phone]

## 2015-02-17 NOTE — Telephone Encounter (Signed)
Follow up scheduled 03-17-15. Will forward this note to dr Williams Che.

## 2015-03-17 ENCOUNTER — Encounter: Payer: Self-pay | Admitting: Cardiology

## 2015-03-17 ENCOUNTER — Ambulatory Visit (INDEPENDENT_AMBULATORY_CARE_PROVIDER_SITE_OTHER): Payer: BLUE CROSS/BLUE SHIELD | Admitting: Cardiology

## 2015-03-17 ENCOUNTER — Encounter: Payer: Self-pay | Admitting: *Deleted

## 2015-03-17 ENCOUNTER — Ambulatory Visit (HOSPITAL_COMMUNITY)
Admission: RE | Admit: 2015-03-17 | Discharge: 2015-03-17 | Disposition: A | Discharge status: Home / Self Care | Payer: BLUE CROSS/BLUE SHIELD | Source: Ambulatory Visit | Attending: Cardiology | Admitting: Cardiology

## 2015-03-17 VITALS — BP 124/88 | HR 60 | Ht 69.0 in | Wt 201.8 lb

## 2015-03-17 DIAGNOSIS — I429 Cardiomyopathy, unspecified: Secondary | ICD-10-CM | POA: Diagnosis not present

## 2015-03-17 DIAGNOSIS — R002 Palpitations: Secondary | ICD-10-CM | POA: Insufficient documentation

## 2015-03-17 MED ORDER — AZITHROMYCIN 250 MG PO TABS: ORAL_TABLET | ORAL | Status: DC

## 2015-03-17 NOTE — Progress Notes (Signed)
Cardiology Office Note   Date:  03/17/2015   ID:  Richard Gardner, DOB 1975-12-30, MRN [id number]  PCP:  Pcp Not In System  Cardiologist:  Dr. Jens Som    No chief complaint on file.     History of Present Illness: Richard Gardner is a 39 y.o. male who presents for NICM and return to work.  In 2005 the patient had a cardiac catheterization that showed nonobstructive coronary disease and an ejection fraction of 47%. Cardiac MRI showed no RV dysplasia; EF 37. He has had problems with ventricular ectopy (question of cardiomyopathy caused his ectopy or ectopy caused his cardiomyopathy). Patient had exercise treadmill that showed no exercise induced ventricular arrhythmias. Patient seen by Dr. 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 Patient also states he was seen at [facility] and had an electrophysiology study for which I do not have the records. But also per Dr. Ladona Ridgel remotely he had an attempted ablation at [facility] which was not successful. 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  Echocardiogram repeated in may of 2014 and showed an ejection fraction of 40-45% and mild left atrial enlargement. Holter monitor in June 2014 showed sinus rhythm with frequent PVCs and occasional couplets.  Toprol 50 mg daily caused fatigue and we changed him to Coreg. Since then he denies dyspnea, chest pain or syncope.  He feels rare palpitations like he has felt for years.  Also complains of sinus infection with green mucus.  Congestion.  He had called to arrange echo and visit, but echo not arranged.  No reason not to dive per Dr. Ladona Ridgel in 2014. He did recommend yearly echoes.   Past Medical History  Diagnosis Date  . Other premature beats   . Secondary cardiomyopathy, unspecified     No past surgical history on file.   Current Outpatient Prescriptions  Medication Sig Dispense Refill  . carvedilol (COREG) 12.5 MG tablet Take 1 tablet (12.5 mg total) by mouth 2 (two) times daily.  60 tablet 12  . chlorpheniramine (ALLERGY) 4 MG tablet Take 4 mg by mouth as needed.      . testosterone cypionate (DEPOTESTOTERONE CYPIONATE) 200 MG/ML injection once a week. One shot weekly    . azithromycin (ZITHROMAX Z-PAK) 250 MG tablet Take as instructed 6 each 0   No current facility-administered medications for this visit.    Allergies:   Review of patient's allergies indicates no known allergies.    Social History:  The patient  reports that he has never smoked. He does not have any smokeless tobacco history on file. He reports that he drinks alcohol. He reports that he does not use illicit drugs.   Family History:  The patient's family history includes CAD in his father; Diabetes type II in his mother; Heart failure in his other.    ROS:  General:no colds or fevers, no weight changes Skin:no rashes or ulcers HEENT:no blurred vision, + congestion see HPI CV:see HPI PUL:see HPI GI:no diarrhea constipation or melena, no indigestion GU:no hematuria, no dysuria MS:no joint pain, no claudication Neuro:no syncope, no lightheadedness Endo:no diabetes, no thyroid disease  Wt Readings from Last 3 Encounters:  03/17/15 201 lb 12.8 oz (91.536 kg)  08/16/13 196 lb 3.2 oz (88.996 kg)  05/14/13 197 lb 1.9 oz (89.413 kg)     PHYSICAL EXAM: VS:  BP 124/88 mmHg  Pulse 60  Ht 5\' 9"  (1.753 m)  Wt 201 lb 12.8 oz (91.536 kg)  BMI 29.79 kg/m2 , BMI Body mass index is 29.79 kg/(m^2). General:Pleasant affect, NAD Skin:Warm and dry, brisk capillary refill HEENT:normocephalic, sclera clear, mucus membranes moist, tender to palpation across face Neck:supple, no JVD, no bruits  Heart:S1S2 RRR without murmur, gallup, rub or click Lungs:clear without rales, rhonchi, or wheezes ZOX:WRUE, non tender, + BS, do not palpate liver spleen or masses Ext:no lower ext edema, 2+ pedal pulses, 2+ radial pulses Neuro:alert and oriented X 3, MAE, follows commands, + facial symmetry    EKG:  EKG is  ordered today. The ekg ordered today demonstrates SR with t wave abnormality in inf. Leads, no changes since 08/16/2013,      Recent Labs: No results found for requested labs within last 365 days.    Lipid Panel No results found for: CHOL, TRIG, HDL, CHOLHDL, VLDL, LDLCALC, LDLDIRECT     Other studies Reviewed: Additional studies/ records that were reviewed today include: previous echo and notes from previous visits.   ASSESSMENT AND PLAN:  1.  NICM will need echo and then will clear for work if stable.  He will follow up with Dr. Velta Addison yearly with echo prior to visit.  2. Palpitations rare.    3. Sinus infection Zpak ordered.    Current medicines are reviewed with the patient today.  The patient Has no concerns regarding medicines.  The following changes have been made:  See above Labs/ tests ordered today include:see above  Disposition:   FU:  see above  Signed, Leone Brand, NP  03/17/2015 10:40 AM    [facility] [address] [place], [place], [place]  [address]/ [address] [place], [place] Phone: [phone]; Fax: [phone]  [phone]

## 2015-03-17 NOTE — Patient Instructions (Signed)
Your physician wants you to follow-up in: ONE YEAR WITH DR Jens Som WITH YEARLY ECHO PRIOR TO APPT You will receive a reminder letter in the mail two months in advance. If you don't receive a letter, please call our office to schedule the follow-up appointment.   Your physician has requested that you have an echocardiogram. Echocardiography is a painless test that uses sound waves to create images of your heart. It provides your doctor with information about the size and shape of your heart and how well your heart's chambers and valves are working. This procedure takes approximately one hour. There are no restrictions for this procedure.TODAY IF POSSIBLE EITHER HERE OR [address] LOCATION

## 2015-11-13 ENCOUNTER — Other Ambulatory Visit: Payer: Self-pay | Admitting: Cardiology

## 2015-11-13 ENCOUNTER — Other Ambulatory Visit: Payer: Self-pay | Admitting: *Deleted

## 2015-11-13 DIAGNOSIS — I429 Cardiomyopathy, unspecified: Secondary | ICD-10-CM

## 2015-11-13 MED ORDER — CARVEDILOL 12.5 MG PO TABS: 12.5000 mg | ORAL_TABLET | Freq: Two times a day (BID) | ORAL | Status: DC

## 2016-10-13 ENCOUNTER — Encounter: Payer: Self-pay | Admitting: Cardiology

## 2016-10-13 NOTE — Progress Notes (Signed)
      HPI: FU nonischemic cardiomyopathy. In 2005 the patient had a cardiac catheterization that showed nonobstructive coronary disease and an ejection fraction of 47%. Cardiac MRI showed no RV dysplasia; EF 37. He has had problems with ventricular ectopy (question if cardiomyopathy caused his ectopy or ectopy caused his cardiomyopathy). Patient had exercise treadmill that showed no exercise induced ventricular arrhythmias. Patient seen by Dr. 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 Patient also states he was seen at [facility] and had an electrophysiology study for which I do not have the records. He did not have ablation. Holter monitor in June 2014 showed sinus rhythm with frequent PVCs and occasional couplets. Last echocardiogram May 2016 showed ejection fraction 50-55% and mild left atrial enlargement. Since last seen, the patient denies any dyspnea on exertion, orthopnea, PND, pedal edema, palpitations, syncope or chest pain.   Current Outpatient Prescriptions  Medication Sig Dispense Refill  . azithromycin (ZITHROMAX Z-PAK) 250 MG tablet Take as instructed 6 each 0  . carvedilol (COREG) 12.5 MG tablet Take 1 tablet (12.5 mg total) by mouth 2 (two) times daily. 60 tablet 11  . chlorpheniramine (ALLERGY) 4 MG tablet Take 4 mg by mouth as needed.      . testosterone cypionate (DEPOTESTOTERONE CYPIONATE) 200 MG/ML injection once a week. One shot weekly     No current facility-administered medications for this visit.      Past Medical History:  Diagnosis Date  . Other premature beats   . Secondary cardiomyopathy, unspecified     History reviewed. No pertinent surgical history.  Social History   Social History  . Marital status: Married    Spouse name: N/A  . Number of children: 2  . Years of education: N/A   Occupational History  .      Theatre stage manager   Social History Main Topics  . Smoking status: Never Smoker  . Smokeless tobacco: Never Used     Comment: socially    . Alcohol use 0.0 oz/week     Comment: socially   . Drug use: No  . Sexual activity: Not on file   Other Topics Concern  . Not on file   Social History Narrative   Full time. Regularly exercises.     Family History  Problem Relation Age of Onset  . CAD Father     MI at age 64  . Diabetes type II Mother   . Heart failure Other     ROS: no fevers or chills, productive cough, hemoptysis, dysphasia, odynophagia, melena, hematochezia, dysuria, hematuria, rash, seizure activity, orthopnea, PND, pedal edema, claudication. Remaining systems are negative.  Physical Exam: Well-developed well-nourished in no acute distress.  Skin is warm and dry.  HEENT is normal.  Neck is supple.  Chest is clear to auscultation with normal expansion.  Cardiovascular exam is regular rate and rhythm.  Abdominal exam nontender or distended. No masses palpated. Extremities show no edema. neuro grossly intact  ECG-Sinus rhythm at a rate of 77. Nonspecific T-wave changes.  A/P  1 History of nonischemic cardiomyopathy-Improved on most recent echocardiogram. Plan repeat study. Continue beta blocker. Would consider ACE inhibitor in the future if LV function reduced.  2 PVCs-continue carvedilol at present dose.  Olga Millers, MD

## 2016-10-18 ENCOUNTER — Encounter: Payer: Self-pay | Admitting: Cardiology

## 2016-10-18 ENCOUNTER — Ambulatory Visit (INDEPENDENT_AMBULATORY_CARE_PROVIDER_SITE_OTHER): Payer: BLUE CROSS/BLUE SHIELD | Admitting: Cardiology

## 2016-10-18 VITALS — BP 126/82 | HR 77 | Ht 69.0 in | Wt 193.0 lb

## 2016-10-18 DIAGNOSIS — I428 Other cardiomyopathies: Secondary | ICD-10-CM

## 2016-10-18 DIAGNOSIS — I493 Ventricular premature depolarization: Secondary | ICD-10-CM | POA: Diagnosis not present

## 2016-10-18 NOTE — Patient Instructions (Signed)

## 2016-10-29 ENCOUNTER — Ambulatory Visit (HOSPITAL_COMMUNITY)
Admission: RE | Admit: 2016-10-29 | Discharge: 2016-10-29 | Disposition: A | Discharge status: Home / Self Care | Payer: BLUE CROSS/BLUE SHIELD | Source: Ambulatory Visit | Attending: Cardiology | Admitting: Cardiology

## 2016-10-29 DIAGNOSIS — I428 Other cardiomyopathies: Secondary | ICD-10-CM | POA: Diagnosis not present

## 2016-10-29 NOTE — Progress Notes (Signed)
  Echocardiogram 2D Echocardiogram has been performed.  Richard Gardner 10/29/2016, 2:14 PM

## 2016-12-16 ENCOUNTER — Other Ambulatory Visit: Payer: Self-pay | Admitting: Cardiology

## 2016-12-16 DIAGNOSIS — I429 Cardiomyopathy, unspecified: Secondary | ICD-10-CM

## 2016-12-16 NOTE — Telephone Encounter (Signed)
Rx(s) sent to pharmacy electronically.  

## 2017-01-06 DIAGNOSIS — E291 Testicular hypofunction: Secondary | ICD-10-CM | POA: Diagnosis not present

## 2017-01-07 ENCOUNTER — Other Ambulatory Visit: Payer: Self-pay

## 2017-01-07 MED ORDER — CARVEDILOL 12.5 MG PO TABS: 12.5000 mg | ORAL_TABLET | Freq: Two times a day (BID) | ORAL | 11 refills | Status: DC

## 2017-01-12 DIAGNOSIS — M62830 Muscle spasm of back: Secondary | ICD-10-CM | POA: Diagnosis not present

## 2017-01-13 DIAGNOSIS — E291 Testicular hypofunction: Secondary | ICD-10-CM | POA: Diagnosis not present

## 2017-08-17 DIAGNOSIS — S0501XA Injury of conjunctiva and corneal abrasion without foreign body, right eye, initial encounter: Secondary | ICD-10-CM | POA: Diagnosis not present

## 2017-09-24 DIAGNOSIS — J00 Acute nasopharyngitis [common cold]: Secondary | ICD-10-CM | POA: Diagnosis not present

## 2017-09-24 DIAGNOSIS — Z23 Encounter for immunization: Secondary | ICD-10-CM | POA: Diagnosis not present

## 2017-09-24 DIAGNOSIS — R0981 Nasal congestion: Secondary | ICD-10-CM | POA: Diagnosis not present

## 2017-10-25 DIAGNOSIS — M25562 Pain in left knee: Secondary | ICD-10-CM | POA: Diagnosis not present

## 2017-10-25 DIAGNOSIS — M545 Low back pain: Secondary | ICD-10-CM | POA: Diagnosis not present

## 2017-10-25 DIAGNOSIS — M791 Myalgia, unspecified site: Secondary | ICD-10-CM | POA: Diagnosis not present

## 2017-10-25 DIAGNOSIS — M542 Cervicalgia: Secondary | ICD-10-CM | POA: Diagnosis not present

## 2017-10-27 DIAGNOSIS — M542 Cervicalgia: Secondary | ICD-10-CM | POA: Diagnosis not present

## 2017-10-27 DIAGNOSIS — M791 Myalgia, unspecified site: Secondary | ICD-10-CM | POA: Diagnosis not present

## 2017-10-27 DIAGNOSIS — M545 Low back pain: Secondary | ICD-10-CM | POA: Diagnosis not present

## 2017-10-27 DIAGNOSIS — M25562 Pain in left knee: Secondary | ICD-10-CM | POA: Diagnosis not present

## 2017-11-10 DIAGNOSIS — M545 Low back pain: Secondary | ICD-10-CM | POA: Diagnosis not present

## 2017-11-10 DIAGNOSIS — M791 Myalgia, unspecified site: Secondary | ICD-10-CM | POA: Diagnosis not present

## 2017-11-10 DIAGNOSIS — M542 Cervicalgia: Secondary | ICD-10-CM | POA: Diagnosis not present

## 2017-11-10 DIAGNOSIS — M25562 Pain in left knee: Secondary | ICD-10-CM | POA: Diagnosis not present

## 2017-11-25 DIAGNOSIS — M25562 Pain in left knee: Secondary | ICD-10-CM | POA: Diagnosis not present

## 2017-11-25 DIAGNOSIS — M791 Myalgia, unspecified site: Secondary | ICD-10-CM | POA: Diagnosis not present

## 2017-11-25 DIAGNOSIS — M545 Low back pain: Secondary | ICD-10-CM | POA: Diagnosis not present

## 2017-11-25 DIAGNOSIS — M542 Cervicalgia: Secondary | ICD-10-CM | POA: Diagnosis not present

## 2017-11-29 DIAGNOSIS — E291 Testicular hypofunction: Secondary | ICD-10-CM | POA: Diagnosis not present

## 2017-12-06 DIAGNOSIS — E291 Testicular hypofunction: Secondary | ICD-10-CM | POA: Diagnosis not present

## 2018-01-09 DIAGNOSIS — F988 Other specified behavioral and emotional disorders with onset usually occurring in childhood and adolescence: Secondary | ICD-10-CM | POA: Diagnosis not present

## 2018-01-09 DIAGNOSIS — G4700 Insomnia, unspecified: Secondary | ICD-10-CM | POA: Diagnosis not present

## 2018-03-07 ENCOUNTER — Encounter: Payer: Self-pay | Admitting: Physician Assistant

## 2018-03-07 ENCOUNTER — Ambulatory Visit: Payer: BLUE CROSS/BLUE SHIELD | Admitting: Physician Assistant

## 2018-03-07 VITALS — BP 128/62 | HR 81 | Ht 69.0 in | Wt 201.8 lb

## 2018-03-07 DIAGNOSIS — I428 Other cardiomyopathies: Secondary | ICD-10-CM

## 2018-03-07 DIAGNOSIS — I493 Ventricular premature depolarization: Secondary | ICD-10-CM

## 2018-03-07 MED ORDER — CARVEDILOL 12.5 MG PO TABS: 12.5000 mg | ORAL_TABLET | Freq: Two times a day (BID) | ORAL | 11 refills | Status: DC

## 2018-03-07 NOTE — Patient Instructions (Signed)
Medication Instructions:  No medication change  Labwork: None  Testing/Procedures: None  Follow-Up: Your physician wants you to follow-up in: 12 month with Dr. Shelda Pal will receive a reminder letter in the mail two months in advance. If you don't receive a letter, please call our office to schedule the follow-up appointment.   Any Other Special Instructions Will Be Listed Below (If Applicable).     If you need a refill on your cardiac medications before your next appointment, please call your pharmacy.

## 2018-03-07 NOTE — Progress Notes (Signed)
Cardiology Office Note    Date:  03/09/2018   ID:  Richard Gardner, DOB [birth year], MRN [id number]  PCP:  System, Pcp Not In  Cardiologist:  Dr. Jens Som   Chief Complaint  Patient presents with  . Follow-up    pt denies chest pains, swelling in hands/feet, SOB    History of Present Illness:  Richard Gardner is a 42 y.o. male with PMH of NICM.  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  Cardiac MRI showed no RV dysplasia, EF 37%.  He also had ventricular ectopy as well.  Exercise treadmill did not show exercise induced ventricular arrhythmia.  He was seen by Dr. 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  Apparently he was also previously seen by [facility] and had a electrophysiology study, he did not have ablation.  Heart monitor in June 2014 showed sinus rhythm with frequent PVCs and occasional couplets.  Echocardiogram in May 2017 showed EF 50 to 55%, mild LAE.  He was last seen by Dr. Jens Som in December 2017, he was doing well at the time.  Repeat echocardiogram was recommended.  This was performed on 10/29/2016, EF 55 to 60%, no significant valvular issue.  Patient presents today for cardiology office visit.  He denies any recent chest pain or shortness of breath.  He has no lower extremity edema, orthopnea or PND on physical exam.  EKG continued to show T wave inversion in the inferior leads, this is chronic for him.  He is still working in the [employer].  Otherwise he has no new issues.  He can follow-up with Dr. Jens Som in 1 year.   Past Medical History:  Diagnosis Date  . Other premature beats   . Secondary cardiomyopathy, unspecified     History reviewed. No pertinent surgical history.  Current Medications: Outpatient Medications Prior to Visit  Medication Sig Dispense Refill  . testosterone cypionate (DEPOTESTOTERONE CYPIONATE) 200 MG/ML injection once a week. One shot weekly    . carvedilol (COREG) 12.5 MG tablet  Take 1 tablet (12.5 mg total) by mouth 2 (two) times daily with a meal. 60 tablet 11  . azithromycin (ZITHROMAX Z-PAK) 250 MG tablet Take as instructed 6 each 0  . chlorpheniramine (ALLERGY) 4 MG tablet Take 4 mg by mouth as needed.       No facility-administered medications prior to visit.      Allergies:   Patient has no known allergies.   Social History   Socioeconomic History  . Marital status: Married    Spouse name: Not on file  . Number of children: 2  . Years of education: Not on file  . Highest education level: Not on file  Occupational History    Comment: Theatre stage manager  Social Needs  . Financial resource strain: Not on file  . Food insecurity:    Worry: Not on file    Inability: Not on file  . Transportation needs:    Medical: Not on file    Non-medical: Not on file  Tobacco Use  . Smoking status: Never Smoker  . Smokeless tobacco: Never Used  . Tobacco comment: socially   Substance and Sexual Activity  . Alcohol use: Yes    Alcohol/week: 0.0 oz    Comment: socially   . Drug use: No  . Sexual activity: Not on file  Lifestyle  . Physical activity:    Days per week: Not on file  Minutes per session: Not on file  . Stress: Not on file  Relationships  . Social connections:    Talks on phone: Not on file    Gets together: Not on file    Attends religious service: Not on file    Active member of club or organization: Not on file    Attends meetings of clubs or organizations: Not on file    Relationship status: Not on file  Other Topics Concern  . Not on file  Social History Narrative   Full time. Regularly exercises.      Family History:  The patient's family history includes CAD in his father; Diabetes type II in his mother; Heart failure in his other.   ROS:   Please see the history of present illness.    ROS All other systems reviewed and are negative.   PHYSICAL EXAM:   VS:  BP 128/62 (BP Location: Left Arm)   Pulse 81   Ht 5\' 9"  (1.753 m)    Wt 201 lb 12.8 oz (91.5 kg)   BMI 29.80 kg/m    GEN: Well nourished, well developed, in no acute distress  HEENT: normal  Neck: no JVD, carotid bruits, or masses Cardiac: RRR; no murmurs, rubs, or gallops,no edema  Respiratory:  clear to auscultation bilaterally, normal work of breathing GI: soft, nontender, nondistended, + BS MS: no deformity or atrophy  Skin: warm and dry, no rash Neuro:  Alert and Oriented x 3, Strength and sensation are intact Psych: euthymic mood, full affect  Wt Readings from Last 3 Encounters:  03/07/18 201 lb 12.8 oz (91.5 kg)  10/18/16 193 lb (87.5 kg)  03/17/15 201 lb 12.8 oz (91.5 kg)      Studies/Labs Reviewed:   EKG:  EKG is ordered today.  The ekg ordered today demonstrates normal sinus rhythm T wave inversion in inferior leads.  Recent Labs: No results found for requested labs within last 8760 hours.   Lipid Panel No results found for: CHOL, TRIG, HDL, CHOLHDL, VLDL, LDLCALC, LDLDIRECT  Additional studies/ records that were reviewed today include:   Echo 10/29/2016 LV EF: 55% -   60%  ------------------------------------------------------------------- Indications:      Cardiomyopathy - dilated 425.4.  ------------------------------------------------------------------- History:   PMH:  PVC&'s. Secondary cardiomyopathy.  ------------------------------------------------------------------- Study Conclusions  - Left ventricle: The cavity size was normal. Systolic function was   normal. The estimated ejection fraction was in the range of 55%   to 60%. Wall motion was normal; there were no regional wall   motion abnormalities. Left ventricular diastolic function   parameters were normal. - Aortic valve: Trileaflet; normal thickness leaflets. There was no   regurgitation. - Aortic root: The aortic root was normal in size. - Mitral valve: There was trivial regurgitation. - Tricuspid valve: There was no regurgitation. - Pulmonary  arteries: Systolic pressure could not be accurately   estimated. - Inferior vena cava: The vessel was normal in size. - Pericardium, extracardiac: There was no pericardial effusion.  Impressions:  - There is no significant change since the prior study on 03/14/15.    ASSESSMENT:    1. NICM (nonischemic cardiomyopathy) (HCC)   2. PVC (premature ventricular contraction)      PLAN:  In order of problems listed above:  1. NICM: Last echocardiogram in December 2017 showed normalization of ejection fraction.  Previous cardiac catheterization demonstrated no significant CAD.  Continue current dose of carvedilol.  He can follow-up in 1 year.  2. PVCs: Well  controlled by carvedilol.    Medication Adjustments/Labs and Tests Ordered: Current medicines are reviewed at length with the patient today.  Concerns regarding medicines are outlined above.  Medication changes, Labs and Tests ordered today are listed in the Patient Instructions below. Patient Instructions  Medication Instructions:  No medication change  Labwork: None  Testing/Procedures: None  Follow-Up: Your physician wants you to follow-up in: 12 month with Dr. Shelda Pal will receive a reminder letter in the mail two months in advance. If you don't receive a letter, please call our office to schedule the follow-up appointment.   Any Other Special Instructions Will Be Listed Below (If Applicable).     If you need a refill on your cardiac medications before your next appointment, please call your pharmacy.      Ramond Dial, [place]  03/09/2018 11:38 PM    [facility] [address] [place], [place], [place]  [address] Phone: [phone]; Fax: [phone]

## 2018-03-09 ENCOUNTER — Encounter: Payer: Self-pay | Admitting: Physician Assistant

## 2018-06-07 ENCOUNTER — Encounter

## 2018-08-30 DIAGNOSIS — M542 Cervicalgia: Secondary | ICD-10-CM | POA: Diagnosis not present

## 2018-08-30 DIAGNOSIS — M25562 Pain in left knee: Secondary | ICD-10-CM | POA: Diagnosis not present

## 2018-08-30 DIAGNOSIS — M545 Low back pain: Secondary | ICD-10-CM | POA: Diagnosis not present

## 2018-08-30 DIAGNOSIS — M791 Myalgia, unspecified site: Secondary | ICD-10-CM | POA: Diagnosis not present

## 2018-10-11 DIAGNOSIS — J00 Acute nasopharyngitis [common cold]: Secondary | ICD-10-CM | POA: Diagnosis not present

## 2018-12-01 DIAGNOSIS — M791 Myalgia, unspecified site: Secondary | ICD-10-CM | POA: Diagnosis not present

## 2018-12-01 DIAGNOSIS — M25562 Pain in left knee: Secondary | ICD-10-CM | POA: Diagnosis not present

## 2018-12-01 DIAGNOSIS — M545 Low back pain: Secondary | ICD-10-CM | POA: Diagnosis not present

## 2018-12-01 DIAGNOSIS — M542 Cervicalgia: Secondary | ICD-10-CM | POA: Diagnosis not present

## 2018-12-07 DIAGNOSIS — M542 Cervicalgia: Secondary | ICD-10-CM | POA: Diagnosis not present

## 2018-12-07 DIAGNOSIS — M25562 Pain in left knee: Secondary | ICD-10-CM | POA: Diagnosis not present

## 2018-12-07 DIAGNOSIS — M791 Myalgia, unspecified site: Secondary | ICD-10-CM | POA: Diagnosis not present

## 2018-12-07 DIAGNOSIS — M545 Low back pain: Secondary | ICD-10-CM | POA: Diagnosis not present

## 2018-12-11 DIAGNOSIS — E291 Testicular hypofunction: Secondary | ICD-10-CM | POA: Diagnosis not present

## 2018-12-14 DIAGNOSIS — E291 Testicular hypofunction: Secondary | ICD-10-CM | POA: Diagnosis not present

## 2018-12-28 DIAGNOSIS — E291 Testicular hypofunction: Secondary | ICD-10-CM | POA: Diagnosis not present

## 2019-01-02 DIAGNOSIS — E291 Testicular hypofunction: Secondary | ICD-10-CM | POA: Diagnosis not present

## 2019-09-06 ENCOUNTER — Telehealth: Payer: Self-pay | Admitting: Cardiology

## 2019-09-06 NOTE — Telephone Encounter (Signed)
  Patient needs has some questions about his diagnosis

## 2019-09-06 NOTE — Telephone Encounter (Signed)
Spoke with pt who states that he took his 54 y o son to see a doctor recently because his son was presenting symptoms similar to what he had in 2004 when he was first diagnosed with cardiomyopathy. He states the doctor his son saw wanted him to find out what type of cardiomyopathy he had to determine if his son would need genetic testing. Reviewed chart. Per last OV note 03/07/2018, pt diagnoses included: 1. NICM (nonischemic cardiomyopathy) ([place])  Pt informed of findings and verbalized understanding. Informed pt that he can have his son's doctor request records from [facility] office if needed. He verbalized understanding

## 2019-11-18 DIAGNOSIS — Z20822 Contact with and (suspected) exposure to covid-19: Secondary | ICD-10-CM | POA: Diagnosis not present

## 2019-12-18 DIAGNOSIS — E291 Testicular hypofunction: Secondary | ICD-10-CM | POA: Diagnosis not present

## 2019-12-20 DIAGNOSIS — E291 Testicular hypofunction: Secondary | ICD-10-CM | POA: Diagnosis not present

## 2019-12-27 DIAGNOSIS — M25562 Pain in left knee: Secondary | ICD-10-CM | POA: Diagnosis not present

## 2019-12-27 DIAGNOSIS — M791 Myalgia, unspecified site: Secondary | ICD-10-CM | POA: Diagnosis not present

## 2019-12-27 DIAGNOSIS — M545 Low back pain: Secondary | ICD-10-CM | POA: Diagnosis not present

## 2019-12-27 DIAGNOSIS — M542 Cervicalgia: Secondary | ICD-10-CM | POA: Diagnosis not present

## 2020-01-01 DIAGNOSIS — M25562 Pain in left knee: Secondary | ICD-10-CM | POA: Diagnosis not present

## 2020-01-01 DIAGNOSIS — M545 Low back pain: Secondary | ICD-10-CM | POA: Diagnosis not present

## 2020-01-01 DIAGNOSIS — M791 Myalgia, unspecified site: Secondary | ICD-10-CM | POA: Diagnosis not present

## 2020-01-01 DIAGNOSIS — M542 Cervicalgia: Secondary | ICD-10-CM | POA: Diagnosis not present

## 2020-02-01 DIAGNOSIS — M549 Dorsalgia, unspecified: Secondary | ICD-10-CM | POA: Diagnosis not present

## 2020-02-12 DIAGNOSIS — E291 Testicular hypofunction: Secondary | ICD-10-CM | POA: Diagnosis not present

## 2020-02-28 ENCOUNTER — Encounter: Payer: Self-pay | Admitting: Adult Health

## 2020-02-28 ENCOUNTER — Telehealth (INDEPENDENT_AMBULATORY_CARE_PROVIDER_SITE_OTHER): Payer: BC Managed Care – PPO | Admitting: Adult Health

## 2020-02-28 VITALS — Ht 69.0 in | Wt 200.0 lb

## 2020-02-28 DIAGNOSIS — I2510 Atherosclerotic heart disease of native coronary artery without angina pectoris: Secondary | ICD-10-CM | POA: Diagnosis not present

## 2020-02-28 DIAGNOSIS — I493 Ventricular premature depolarization: Secondary | ICD-10-CM

## 2020-02-28 DIAGNOSIS — I428 Other cardiomyopathies: Secondary | ICD-10-CM | POA: Diagnosis not present

## 2020-02-28 NOTE — Patient Instructions (Signed)
Medication Instructions:  Continue current medications  *If you need a refill on your cardiac medications before your next appointment, please call your pharmacy*   Lab Work: None Ordered  Testing/Procedures: None Ordered   Follow-Up: At [facility], you and your health needs are our priority.  As part of our continuing mission to provide you with exceptional heart care, we have created designated Provider Care Teams.  These Care Teams include your primary Cardiologist (physician) and Advanced Practice Providers (APPs -  Physician Assistants and Nurse Practitioners) who all work together to provide you with the care you need, when you need it.  We recommend signing up for the patient portal called "MyChart".  Sign up information is provided on this After Visit Summary.  MyChart is used to connect with patients for Virtual Visits (Telemedicine).  Patients are able to view lab/test results, encounter notes, upcoming appointments, etc.  Non-urgent messages can be sent to your provider as well.   To learn more about what you can do with MyChart, go to [email or web address].    Your next appointment:   1 year(s)  The format for your next appointment:   In Person  Provider:   You may see James Hochrein, MD or one of the following Advanced Practice Providers on your designated Care Team:    Rhonda Barrett, PA-C  Kathryn Lawrence, DNP, ANP  Cadence Furth, NP     

## 2020-02-28 NOTE — Progress Notes (Signed)
Virtual Visit via Telephone Note   This visit type was conducted due to national recommendations for restrictions regarding the COVID-19 Pandemic (e.g. social distancing) in an effort to limit this patient's exposure and mitigate transmission in our community.  Due to his co-morbid illnesses, this patient is at least at moderate risk for complications without adequate follow up.  This format is felt to be most appropriate for this patient at this time.  The patient did not have access to video technology/had technical difficulties with video requiring transitioning to audio format only (telephone).  All issues noted in this document were discussed and addressed.  No physical exam could be performed with this format.  Please refer to the patient's chart for his  consent to telehealth for [facility].   Date:  02/28/2020   ID:  Richard Gardner, DOB September 09, 1976, MRN [id number]  Patient Location: Home Provider Location: Home  PCP:  System, Pcp Not In  Cardiologist:  Dr.Crenshaw  Electrophysiologist:  None   Evaluation Performed:  Follow-Up Visit  Chief Complaint:  Follow up  History of Present Illness:    Richard Gardner is a 44 y.o. male with with PMH of NICM.  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  Cardiac MRI showed no RV dysplasia, EF 37%.  He also had ventricular ectopy as well.  Exercise treadmill did not show exercise induced ventricular arrhythmia.  He was seen by Dr. 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    Apparently he was also previously seen by [facility] and had a electrophysiology study, he did not have ablation.  Heart monitor in June 2014 showed sinus rhythm with frequent PVCs and occasional couplets.  Echocardiogram in May 2017 showed EF 50 to 55%, mild LAE.  He was last seen by Dr. Jens Som in December 2017, he was doing well at the time.  Repeat echocardiogram was recommended.  This was performed on 10/29/2016, EF 55 to  60%, no significant valvular issue.  He was last seen in the office 2 years ago, on 03/07/2018 by [name], PA.  At that office visit he was without complaints or symptoms.  His EKG continues to show T wave inversion in the inferior leads which was chronic for him.  He was continue to work at the [employer]. He was continued on carvedilol.  He is here for follow-up.  Continues to have chronic back pain. Followed by orthopedics. Having degenerative disc disease. He has labs drawn through the fire department where he lives in [place]. He does not need refills currently on his medications. BP has been stable. He denies racing or irregular HR. No chest pain, fatigue, DOE. 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   The patient does not have symptoms concerning for COVID-19 infection (fever, chills, cough, or new shortness of breath).    Past Medical History:  Diagnosis Date  . Other premature beats   . Secondary cardiomyopathy, unspecified    No past surgical history on file.   Current Meds  Medication Sig  . carvedilol (COREG) 12.5 MG tablet Take 1 tablet (12.5 mg total) by mouth 2 (two) times daily with a meal.  . testosterone cypionate (DEPOTESTOTERONE CYPIONATE) 200 MG/ML injection once a week. One shot weekly     Allergies:   Patient has no known allergies.   Social History   Tobacco Use  . Smoking status: Never Smoker  . Smokeless tobacco: Never  Used  . Tobacco comment: socially   Substance Use Topics  . Alcohol use: Yes    Alcohol/week: 0.0 standard drinks    Comment: socially   . Drug use: No     Family Hx: The patient's family history includes CAD in his father; Diabetes type II in his mother; Heart failure in an other family member.  ROS:   Please see the history of present illness.    All other systems reviewed and are negative.   Prior CV studies:   The following studies were reviewed today:  Echocardiogram 10/29/2016 Left ventricle: The  cavity size was normal. Systolic function was normal. The estimated ejection fraction was in the range of 55% to 60%. Wall motion was normal; there were no regional wall motion abnormalities. Left ventricular diastolic function parameters were normal. - Aortic valve: Trileaflet; normal thickness leaflets. There was no regurgitation. - Aortic root: The aortic root was normal in size. - Mitral valve: There was trivial regurgitation. - Tricuspid valve: There was no regurgitation. - Pulmonary arteries: Systolic pressure could not be accurately estimated. - Inferior vena cava: The vessel was normal in size. - Pericardium, extracardiac: There was no pericardial effusion.  Impressions:  - There is no significant change since the prior study on 03/14/15.   Labs/Other Tests and Data Reviewed:    EKG:  No ECG reviewed.  Recent Labs: No results found for requested labs within last 8760 hours.   Recent Lipid Panel No results found for: CHOL, TRIG, HDL, CHOLHDL, LDLCALC, LDLDIRECT  Wt Readings from Last 3 Encounters:  02/28/20 200 lb (90.7 kg)  03/07/18 201 lb 12.8 oz (91.5 kg)  10/18/16 193 lb (87.5 kg)     Objective:    Vital Signs:  Ht 5\' 9"  (1.753 m)   Wt 200 lb (90.7 kg)   BMI 29.53 kg/m    VITAL SIGNS:  reviewed GEN:  no acute distress RESPIRATORY:  normal respiratory effort, symmetric expansion NEURO:  alert and oriented x 3, no obvious focal deficit PSYCH:  normal affect  ASSESSMENT & PLAN:    1. NICM: EF of 50%-55%. Would like to have follow up echo completed at some point. He lives in [place].  Will need to follow this since he has not had echo since 2017. He is completely asymptomatic. 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 Will continue current regimen.  2. CAD: Non-obstructive per cardiac cath in 2015. He does not report any symptoms of ischemia. Will continue to follow this.   Stress test or cardiac CTA if new symptoms occur.  3. Unknown lipid status: He gets labs drawn through the fire department.  Currently not on statin. Will need to follow this. Should be seen in person next visit in one year.   COVID-19 Education: The signs and symptoms of COVID-19 were discussed with the patient and how to seek care for testing (follow up with PCP or arrange E-visit).  The importance of social distancing was discussed today.  Time:   Today, I have spent 20 minutes with the patient with telehealth technology discussing the above problems, chart review and documentation .     Medication Adjustments/Labs and Tests Ordered: Current medicines are reviewed at length with the patient today.  Concerns regarding medicines are outlined above.   Tests Ordered: No orders of the defined types were placed in this encounter.   Medication Changes: No orders of the defined types were placed in  this encounter.   Disposition:  Follow up 1 year unless symptomatic   Signed, Phill Myron. [place] Pugh, ANP, AACC  02/28/2020 9:18 AM    [place]

## 2020-03-14 DIAGNOSIS — M549 Dorsalgia, unspecified: Secondary | ICD-10-CM | POA: Diagnosis not present

## 2020-05-05 DIAGNOSIS — E291 Testicular hypofunction: Secondary | ICD-10-CM | POA: Diagnosis not present

## 2020-06-25 DIAGNOSIS — M5116 Intervertebral disc disorders with radiculopathy, lumbar region: Secondary | ICD-10-CM | POA: Diagnosis not present

## 2020-06-25 DIAGNOSIS — M9905 Segmental and somatic dysfunction of pelvic region: Secondary | ICD-10-CM | POA: Diagnosis not present

## 2020-06-25 DIAGNOSIS — M62830 Muscle spasm of back: Secondary | ICD-10-CM | POA: Diagnosis not present

## 2020-06-25 DIAGNOSIS — M9903 Segmental and somatic dysfunction of lumbar region: Secondary | ICD-10-CM | POA: Diagnosis not present

## 2020-07-02 DIAGNOSIS — M62830 Muscle spasm of back: Secondary | ICD-10-CM | POA: Diagnosis not present

## 2020-07-02 DIAGNOSIS — M9905 Segmental and somatic dysfunction of pelvic region: Secondary | ICD-10-CM | POA: Diagnosis not present

## 2020-07-02 DIAGNOSIS — M9903 Segmental and somatic dysfunction of lumbar region: Secondary | ICD-10-CM | POA: Diagnosis not present

## 2020-07-02 DIAGNOSIS — M5116 Intervertebral disc disorders with radiculopathy, lumbar region: Secondary | ICD-10-CM | POA: Diagnosis not present

## 2020-07-09 DIAGNOSIS — M9905 Segmental and somatic dysfunction of pelvic region: Secondary | ICD-10-CM | POA: Diagnosis not present

## 2020-07-09 DIAGNOSIS — M5116 Intervertebral disc disorders with radiculopathy, lumbar region: Secondary | ICD-10-CM | POA: Diagnosis not present

## 2020-07-09 DIAGNOSIS — M62830 Muscle spasm of back: Secondary | ICD-10-CM | POA: Diagnosis not present

## 2020-07-09 DIAGNOSIS — M9903 Segmental and somatic dysfunction of lumbar region: Secondary | ICD-10-CM | POA: Diagnosis not present

## 2020-08-04 DIAGNOSIS — M62830 Muscle spasm of back: Secondary | ICD-10-CM | POA: Diagnosis not present

## 2020-08-04 DIAGNOSIS — M5116 Intervertebral disc disorders with radiculopathy, lumbar region: Secondary | ICD-10-CM | POA: Diagnosis not present

## 2020-08-04 DIAGNOSIS — M9903 Segmental and somatic dysfunction of lumbar region: Secondary | ICD-10-CM | POA: Diagnosis not present

## 2020-08-04 DIAGNOSIS — M9905 Segmental and somatic dysfunction of pelvic region: Secondary | ICD-10-CM | POA: Diagnosis not present

## 2020-08-22 DIAGNOSIS — M5116 Intervertebral disc disorders with radiculopathy, lumbar region: Secondary | ICD-10-CM | POA: Diagnosis not present

## 2020-08-22 DIAGNOSIS — M9905 Segmental and somatic dysfunction of pelvic region: Secondary | ICD-10-CM | POA: Diagnosis not present

## 2020-08-22 DIAGNOSIS — M9906 Segmental and somatic dysfunction of lower extremity: Secondary | ICD-10-CM | POA: Diagnosis not present

## 2020-08-22 DIAGNOSIS — M9903 Segmental and somatic dysfunction of lumbar region: Secondary | ICD-10-CM | POA: Diagnosis not present

## 2021-02-02 DIAGNOSIS — M9901 Segmental and somatic dysfunction of cervical region: Secondary | ICD-10-CM | POA: Diagnosis not present

## 2021-02-02 DIAGNOSIS — M9907 Segmental and somatic dysfunction of upper extremity: Secondary | ICD-10-CM | POA: Diagnosis not present

## 2021-02-02 DIAGNOSIS — G540 Brachial plexus disorders: Secondary | ICD-10-CM | POA: Diagnosis not present

## 2021-02-02 DIAGNOSIS — M9902 Segmental and somatic dysfunction of thoracic region: Secondary | ICD-10-CM | POA: Diagnosis not present

## 2021-06-01 DIAGNOSIS — M9902 Segmental and somatic dysfunction of thoracic region: Secondary | ICD-10-CM | POA: Diagnosis not present

## 2021-06-01 DIAGNOSIS — S29012A Strain of muscle and tendon of back wall of thorax, initial encounter: Secondary | ICD-10-CM | POA: Diagnosis not present

## 2021-06-01 DIAGNOSIS — M5137 Other intervertebral disc degeneration, lumbosacral region: Secondary | ICD-10-CM | POA: Diagnosis not present

## 2021-06-01 DIAGNOSIS — M62830 Muscle spasm of back: Secondary | ICD-10-CM | POA: Diagnosis not present

## 2021-06-01 DIAGNOSIS — M9901 Segmental and somatic dysfunction of cervical region: Secondary | ICD-10-CM | POA: Diagnosis not present

## 2021-06-01 DIAGNOSIS — M5136 Other intervertebral disc degeneration, lumbar region: Secondary | ICD-10-CM | POA: Diagnosis not present

## 2021-06-01 DIAGNOSIS — M5032 Other cervical disc degeneration, mid-cervical region, unspecified level: Secondary | ICD-10-CM | POA: Diagnosis not present

## 2021-06-10 DIAGNOSIS — M5137 Other intervertebral disc degeneration, lumbosacral region: Secondary | ICD-10-CM | POA: Diagnosis not present

## 2021-06-10 DIAGNOSIS — M9902 Segmental and somatic dysfunction of thoracic region: Secondary | ICD-10-CM | POA: Diagnosis not present

## 2021-06-10 DIAGNOSIS — M5136 Other intervertebral disc degeneration, lumbar region: Secondary | ICD-10-CM | POA: Diagnosis not present

## 2021-06-10 DIAGNOSIS — M9901 Segmental and somatic dysfunction of cervical region: Secondary | ICD-10-CM | POA: Diagnosis not present

## 2021-06-10 DIAGNOSIS — S29012A Strain of muscle and tendon of back wall of thorax, initial encounter: Secondary | ICD-10-CM | POA: Diagnosis not present

## 2021-06-10 DIAGNOSIS — M5032 Other cervical disc degeneration, mid-cervical region, unspecified level: Secondary | ICD-10-CM | POA: Diagnosis not present

## 2021-06-10 DIAGNOSIS — M62830 Muscle spasm of back: Secondary | ICD-10-CM | POA: Diagnosis not present

## 2021-10-28 DIAGNOSIS — M9902 Segmental and somatic dysfunction of thoracic region: Secondary | ICD-10-CM | POA: Diagnosis not present

## 2021-10-28 DIAGNOSIS — M5032 Other cervical disc degeneration, mid-cervical region, unspecified level: Secondary | ICD-10-CM | POA: Diagnosis not present

## 2021-10-28 DIAGNOSIS — M5137 Other intervertebral disc degeneration, lumbosacral region: Secondary | ICD-10-CM | POA: Diagnosis not present

## 2021-10-28 DIAGNOSIS — S29012A Strain of muscle and tendon of back wall of thorax, initial encounter: Secondary | ICD-10-CM | POA: Diagnosis not present

## 2021-10-28 DIAGNOSIS — M62830 Muscle spasm of back: Secondary | ICD-10-CM | POA: Diagnosis not present

## 2021-10-28 DIAGNOSIS — M5136 Other intervertebral disc degeneration, lumbar region: Secondary | ICD-10-CM | POA: Diagnosis not present

## 2021-10-28 DIAGNOSIS — M9901 Segmental and somatic dysfunction of cervical region: Secondary | ICD-10-CM | POA: Diagnosis not present

## 2021-11-12 DIAGNOSIS — J069 Acute upper respiratory infection, unspecified: Secondary | ICD-10-CM | POA: Diagnosis not present

## 2021-11-13 DIAGNOSIS — M5136 Other intervertebral disc degeneration, lumbar region: Secondary | ICD-10-CM | POA: Diagnosis not present

## 2021-11-13 DIAGNOSIS — M5137 Other intervertebral disc degeneration, lumbosacral region: Secondary | ICD-10-CM | POA: Diagnosis not present

## 2021-11-13 DIAGNOSIS — M9902 Segmental and somatic dysfunction of thoracic region: Secondary | ICD-10-CM | POA: Diagnosis not present

## 2021-11-13 DIAGNOSIS — S29012A Strain of muscle and tendon of back wall of thorax, initial encounter: Secondary | ICD-10-CM | POA: Diagnosis not present

## 2021-11-13 DIAGNOSIS — M9901 Segmental and somatic dysfunction of cervical region: Secondary | ICD-10-CM | POA: Diagnosis not present

## 2021-11-13 DIAGNOSIS — M5032 Other cervical disc degeneration, mid-cervical region, unspecified level: Secondary | ICD-10-CM | POA: Diagnosis not present

## 2021-11-13 DIAGNOSIS — M62830 Muscle spasm of back: Secondary | ICD-10-CM | POA: Diagnosis not present

## 2021-11-26 DIAGNOSIS — I428 Other cardiomyopathies: Secondary | ICD-10-CM | POA: Diagnosis not present

## 2021-11-26 DIAGNOSIS — J069 Acute upper respiratory infection, unspecified: Secondary | ICD-10-CM | POA: Diagnosis not present

## 2021-11-26 DIAGNOSIS — U071 COVID-19: Secondary | ICD-10-CM | POA: Diagnosis not present

## 2022-02-12 DIAGNOSIS — M9902 Segmental and somatic dysfunction of thoracic region: Secondary | ICD-10-CM | POA: Diagnosis not present

## 2022-02-12 DIAGNOSIS — M5136 Other intervertebral disc degeneration, lumbar region: Secondary | ICD-10-CM | POA: Diagnosis not present

## 2022-02-12 DIAGNOSIS — M9901 Segmental and somatic dysfunction of cervical region: Secondary | ICD-10-CM | POA: Diagnosis not present

## 2022-02-12 DIAGNOSIS — M5137 Other intervertebral disc degeneration, lumbosacral region: Secondary | ICD-10-CM | POA: Diagnosis not present

## 2022-02-12 DIAGNOSIS — M5032 Other cervical disc degeneration, mid-cervical region, unspecified level: Secondary | ICD-10-CM | POA: Diagnosis not present

## 2022-02-22 DIAGNOSIS — M62830 Muscle spasm of back: Secondary | ICD-10-CM | POA: Diagnosis not present

## 2022-02-22 DIAGNOSIS — S29012A Strain of muscle and tendon of back wall of thorax, initial encounter: Secondary | ICD-10-CM | POA: Diagnosis not present

## 2022-02-22 DIAGNOSIS — M9902 Segmental and somatic dysfunction of thoracic region: Secondary | ICD-10-CM | POA: Diagnosis not present

## 2022-02-22 DIAGNOSIS — M5137 Other intervertebral disc degeneration, lumbosacral region: Secondary | ICD-10-CM | POA: Diagnosis not present

## 2022-02-22 DIAGNOSIS — M5136 Other intervertebral disc degeneration, lumbar region: Secondary | ICD-10-CM | POA: Diagnosis not present

## 2022-02-22 DIAGNOSIS — M9901 Segmental and somatic dysfunction of cervical region: Secondary | ICD-10-CM | POA: Diagnosis not present

## 2022-02-22 DIAGNOSIS — M5032 Other cervical disc degeneration, mid-cervical region, unspecified level: Secondary | ICD-10-CM | POA: Diagnosis not present

## 2022-03-05 DIAGNOSIS — M62830 Muscle spasm of back: Secondary | ICD-10-CM | POA: Diagnosis not present

## 2022-03-05 DIAGNOSIS — M5032 Other cervical disc degeneration, mid-cervical region, unspecified level: Secondary | ICD-10-CM | POA: Diagnosis not present

## 2022-03-05 DIAGNOSIS — S29012A Strain of muscle and tendon of back wall of thorax, initial encounter: Secondary | ICD-10-CM | POA: Diagnosis not present

## 2022-03-05 DIAGNOSIS — M5136 Other intervertebral disc degeneration, lumbar region: Secondary | ICD-10-CM | POA: Diagnosis not present

## 2022-03-05 DIAGNOSIS — M5137 Other intervertebral disc degeneration, lumbosacral region: Secondary | ICD-10-CM | POA: Diagnosis not present

## 2022-03-05 DIAGNOSIS — M9902 Segmental and somatic dysfunction of thoracic region: Secondary | ICD-10-CM | POA: Diagnosis not present

## 2022-03-05 DIAGNOSIS — M9901 Segmental and somatic dysfunction of cervical region: Secondary | ICD-10-CM | POA: Diagnosis not present

## 2022-06-09 DIAGNOSIS — M5032 Other cervical disc degeneration, mid-cervical region, unspecified level: Secondary | ICD-10-CM | POA: Diagnosis not present

## 2022-06-09 DIAGNOSIS — S161XXA Strain of muscle, fascia and tendon at neck level, initial encounter: Secondary | ICD-10-CM | POA: Diagnosis not present

## 2022-06-09 DIAGNOSIS — M5137 Other intervertebral disc degeneration, lumbosacral region: Secondary | ICD-10-CM | POA: Diagnosis not present

## 2022-06-09 DIAGNOSIS — M5136 Other intervertebral disc degeneration, lumbar region: Secondary | ICD-10-CM | POA: Diagnosis not present

## 2022-06-09 DIAGNOSIS — M62830 Muscle spasm of back: Secondary | ICD-10-CM | POA: Diagnosis not present

## 2022-06-09 DIAGNOSIS — M9901 Segmental and somatic dysfunction of cervical region: Secondary | ICD-10-CM | POA: Diagnosis not present

## 2022-06-09 DIAGNOSIS — M9902 Segmental and somatic dysfunction of thoracic region: Secondary | ICD-10-CM | POA: Diagnosis not present

## 2022-06-09 DIAGNOSIS — S29012A Strain of muscle and tendon of back wall of thorax, initial encounter: Secondary | ICD-10-CM | POA: Diagnosis not present

## 2022-07-23 DIAGNOSIS — R6882 Decreased libido: Secondary | ICD-10-CM | POA: Diagnosis not present

## 2022-07-23 DIAGNOSIS — R5383 Other fatigue: Secondary | ICD-10-CM | POA: Diagnosis not present

## 2022-07-23 DIAGNOSIS — E291 Testicular hypofunction: Secondary | ICD-10-CM | POA: Diagnosis not present

## 2022-12-24 ENCOUNTER — Telehealth: Payer: Self-pay | Admitting: Cardiology

## 2022-12-24 NOTE — Telephone Encounter (Signed)
Left message for pt to call to schedule appointment

## 2022-12-24 NOTE — Telephone Encounter (Signed)
New Message:     Patient says his employer says he needs an Echo. Patient would like fro Dr Stanford Breed to order his Echo please.

## 2022-12-24 NOTE — Telephone Encounter (Signed)
Spoke with patient of Dr. Stanford Breed Last last in person visit was 2019 He had a video visit in 2021 His work requires him to have an echo, or he will get "pulled off the truck"  He has a 90 day grace period to get this echo, from 12/21/22  There are some new patient appointments coming up with Dr. Stanford Breed -- unsure if he needs an in-person visit or video visit OK?   Will send to MD/RN to review

## 2022-12-24 NOTE — Telephone Encounter (Signed)
Spoke with pt, Follow up scheduled  

## 2022-12-28 NOTE — Progress Notes (Unsigned)
     HPI: FU nonischemic cardiomyopathy. In 2005 the patient had a cardiac catheterization that showed nonobstructive coronary disease and an ejection fraction of 47%. Cardiac MRI showed no RV dysplasia; EF 37. He has had problems with ventricular ectopy (question if cardiomyopathy caused his ectopy or ectopy caused his cardiomyopathy). Patient had exercise treadmill that showed no exercise induced ventricular arrhythmias. Patient seen by Dr. 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 Patient also states he was seen at [facility] and had an electrophysiology study for which I do not have the records. He did not have ablation. Holter monitor in June 2014 showed sinus rhythm with frequent PVCs and occasional couplets. Last echocardiogram May 2016 showed ejection fraction 50-55% and mild left atrial enlargement.  Echocardiogram December 2017 showed normal LV function.  Since last seen, the patient denies any dyspnea on exertion, orthopnea, PND, pedal edema, palpitations, syncope or chest pain.   Current Outpatient Medications  Medication Sig Dispense Refill   carvedilol (COREG) 12.5 MG tablet Take 1 tablet (12.5 mg total) by mouth 2 (two) times daily with a meal. 60 tablet 11   No current facility-administered medications for this visit.     Past Medical History:  Diagnosis Date   Other premature beats    Secondary cardiomyopathy, unspecified     History reviewed. No pertinent surgical history.  Social History   Socioeconomic History   Marital status: Married    Spouse name: Not on file   Number of children: 2   Years of education: Not on file   Highest education level: Not on file  Occupational History    Comment: IT trainer  Tobacco Use   Smoking status: Never   Smokeless tobacco: Never   Tobacco comments:    socially   Substance and Sexual Activity   Alcohol use: Yes    Alcohol/week: 0.0 standard drinks of alcohol    Comment: socially    Drug use: No   Sexual  activity: Not on file  Other Topics Concern   Not on file  Social History Narrative   Full time. Regularly exercises.    Social Determinants of Health   Financial Resource Strain: Not on file  Food Insecurity: Not on file  Transportation Needs: Not on file  Physical Activity: Not on file  Stress: Not on file  Social Connections: Not on file  Intimate Partner Violence: Not on file    Family History  Problem Relation Age of Onset   CAD Father        MI at age 11   Diabetes type II Mother    Heart failure Other     ROS: no fevers or chills, productive cough, hemoptysis, dysphasia, odynophagia, melena, hematochezia, dysuria, hematuria, rash, seizure activity, orthopnea, PND, pedal edema, claudication. Remaining systems are negative.  Physical Exam: Well-developed well-nourished in no acute distress.  Skin is warm and dry.  HEENT is normal.  Neck is supple.  Chest is clear to auscultation with normal expansion.  Cardiovascular exam is regular rate and rhythm.  Abdominal exam nontender or distended. No masses palpated. Extremities show no edema. neuro grossly intact  ECG-normal sinus rhythm at a rate of 85, PVCs.  Personally reviewed  A/P  1 history of nonischemic cardiomyopathy-LV function has normalized on most recent echocardiogram.  Will repeat study.  Continue beta-blocker.  Question if ectopy contributed to cardiomyopathy in the past.  2 history of PVCs-continue beta-blocker.  Kirk Ruths, MD

## 2022-12-29 ENCOUNTER — Encounter: Payer: Self-pay | Admitting: Cardiology

## 2022-12-29 ENCOUNTER — Ambulatory Visit: Payer: BC Managed Care – PPO | Attending: Cardiology | Admitting: Cardiology

## 2022-12-29 VITALS — BP 132/70 | HR 85 | Ht 68.0 in | Wt 205.6 lb

## 2022-12-29 DIAGNOSIS — I420 Dilated cardiomyopathy: Secondary | ICD-10-CM | POA: Diagnosis not present

## 2022-12-29 MED ORDER — CARVEDILOL 12.5 MG PO TABS: 12.5000 mg | ORAL_TABLET | Freq: Two times a day (BID) | ORAL | 3 refills | Status: DC

## 2022-12-29 NOTE — Patient Instructions (Signed)
  Testing/Procedures:  Your physician has requested that you have an echocardiogram. Echocardiography is a painless test that uses sound waves to create images of your heart. It provides your doctor with information about the size and shape of your heart and how well your heart's chambers and valves are working. This procedure takes approximately one hour. There are no restrictions for this procedure. Please do NOT wear cologne, perfume, aftershave, or lotions (deodorant is allowed). Please arrive 15 minutes prior to your appointment time. [address]   Follow-Up: At [facility], you and your health needs are our priority.  As part of our continuing mission to provide you with exceptional heart care, we have created designated Provider Care Teams.  These Care Teams include your primary Cardiologist (physician) and Advanced Practice Providers (APPs -  Physician Assistants and Nurse Practitioners) who all work together to provide you with the care you need, when you need it.  We recommend signing up for the patient portal called "MyChart".  Sign up information is provided on this After Visit Summary.  MyChart is used to connect with patients for Virtual Visits (Telemedicine).  Patients are able to view lab/test results, encounter notes, upcoming appointments, etc.  Non-urgent messages can be sent to your provider as well.   To learn more about what you can do with MyChart, go to [email or web address].    Your next appointment:   12 month(s)  Provider:   BRIAN CRENSHAW MD    

## 2023-01-31 ENCOUNTER — Ambulatory Visit (HOSPITAL_COMMUNITY): Payer: BC Managed Care – PPO | Attending: Cardiology

## 2023-01-31 DIAGNOSIS — I420 Dilated cardiomyopathy: Secondary | ICD-10-CM | POA: Insufficient documentation

## 2023-01-31 LAB — ECHOCARDIOGRAM COMPLETE
Area-P 1/2: 3.23 cm2
S' Lateral: 3.9 cm

## 2023-02-03 ENCOUNTER — Telehealth: Payer: Self-pay | Admitting: Cardiology

## 2023-02-03 MED ORDER — CARVEDILOL 12.5 MG PO TABS: 12.5000 mg | ORAL_TABLET | Freq: Two times a day (BID) | ORAL | 3 refills | Status: DC

## 2023-02-03 NOTE — Telephone Encounter (Signed)
Lelon Perla, MD  Cv Div Nl TriageJust now (4:16 PM)    Ok for coreg 12.5 bid Kirk Ruths    Returned call to patient and made him aware this was okay per Dr. 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 Stanford Breed for him to advise- patient will send a MyChart message with fax number that letter needs to go to.  Patient verbalized understanding.

## 2023-02-03 NOTE — Telephone Encounter (Signed)
Called patient with echo results and medication recommendations of increase coreg to 25 mg BID. Patient states that he was not taking his medication as prescribed originally because he had been out. Richard Gardner  He just started taking it regularly after last OV 2/16.  He ask should he just take the original 12.5mg  BID for the next 6 months rather than increase, since he was not taking it.  Please advised

## 2023-02-03 NOTE — Telephone Encounter (Signed)
Patient is requesting to have a nurse call him to go over his echo results. Please advise.

## 2023-02-07 NOTE — Telephone Encounter (Signed)
OK for letter to continue his job Richard Gardner   Letter sent to patients MyChart- attempted to call patient to make aware but unable to reach, left message stating that letter is available in [place].

## 2023-05-31 NOTE — Progress Notes (Deleted)
HPI: FU nonischemic cardiomyopathy. In 2005 the patient had a cardiac catheterization that showed nonobstructive coronary disease and an ejection fraction of 47%. Cardiac MRI showed no RV dysplasia; EF 37. He has had problems with ventricular ectopy (question if cardiomyopathy caused his ectopy or ectopy caused his cardiomyopathy). Patient had exercise treadmill that showed no exercise induced ventricular arrhythmias. Patient seen by Dr. 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 Patient also states he was seen at [facility] and had an electrophysiology study for which I do not have the records. He did not have ablation. Holter monitor in June 2014 showed sinus rhythm with frequent PVCs and occasional couplets. Echocardiogram December 2017 showed normal LV function.  Follow-up echocardiogram March 2024 showed ejection fraction 45 to 50%, grade 1 diastolic dysfunction, trace aortic insufficiency.  Since last seen,   Current Outpatient Medications  Medication Sig Dispense Refill   carvedilol (COREG) 12.5 MG tablet Take 1 tablet (12.5 mg total) by mouth 2 (two) times daily with a meal. 180 tablet 3   No current facility-administered medications for this visit.     Past Medical History:  Diagnosis Date   Other premature beats    Secondary cardiomyopathy, unspecified     No past surgical history on file.  Social History   Socioeconomic History   Marital status: Married    Spouse name: Not on file   Number of children: 2   Years of education: Not on file   Highest education level: Not on file  Occupational History    Comment: Theatre stage manager  Tobacco Use   Smoking status: Never   Smokeless tobacco: Never   Tobacco comments:    socially   Substance and Sexual Activity   Alcohol use: Yes    Alcohol/week: 0.0 standard drinks of alcohol    Comment: socially    Drug use: No   Sexual activity: Not on file  Other Topics Concern   Not on file  Social History Narrative   Full  time. Regularly exercises.    Social Determinants of Health   Financial Resource Strain: Not on file  Food Insecurity: Not on file  Transportation Needs: Not on file  Physical Activity: Not on file  Stress: Not on file  Social Connections: Unknown (03/17/2022)   Received from [facility]   Social Network    Social Network: Not on file  Intimate Partner Violence: Unknown (02/10/2022)   Received from [facility]   HITS    Physically Hurt: Not on file    Insult or Talk Down To: Not on file    Threaten Physical Harm: Not on file    Scream or Curse: Not on file    Family History  Problem Relation Age of Onset   CAD Father        MI at age 78   Diabetes type II Mother    Heart failure Other     ROS: no fevers or chills, productive cough, hemoptysis, dysphasia, odynophagia, melena, hematochezia, dysuria, hematuria, rash, seizure activity, orthopnea, PND, pedal edema, claudication. Remaining systems are negative.  Physical Exam: Well-developed well-nourished in no acute distress.  Skin is warm and dry.  HEENT is normal.  Neck is supple.  Chest is clear to auscultation with normal expansion.  Cardiovascular exam is regular rate and rhythm.  Abdominal exam nontender or distended. No masses palpated. Extremities show no edema. neuro grossly intact  ECG- personally reviewed  A/P  1 nonischemic cardiomyopathy-LV function mildly reduced  on most recent echocardiogram.  Will continue carvedilol.  Add losartan 25 mg daily.  Check potassium and renal function in 1 week.  Etiology of previous cardiomyopathy unclear but may have been a contribution from ectopy.  2 PVCs-continue beta-blocker.  Olga Millers, MD

## 2023-06-14 ENCOUNTER — Ambulatory Visit: Payer: BC Managed Care – PPO | Admitting: Cardiology

## 2024-03-19 ENCOUNTER — Telehealth: Payer: Self-pay | Admitting: Cardiology

## 2024-03-19 MED ORDER — CARVEDILOL 12.5 MG PO TABS: 12.5000 mg | ORAL_TABLET | Freq: Two times a day (BID) | ORAL | 0 refills | Status: DC

## 2024-03-19 NOTE — Telephone Encounter (Signed)
 Pt's medication was sent to pt's pharmacy as requested. Confirmation received.

## 2024-03-19 NOTE — Telephone Encounter (Signed)
*  STAT* If patient is at the pharmacy, call can be transferred to refill team.   1. Which medications need to be refilled? (please list name of each medication and dose if known) carvedilol  (COREG ) 12.5 MG tablet    2. Would you like to learn more about the convenience, safety, & potential cost savings by using the [facility]?   3. Are you open to using the [facility] (Type [facility]. ).   4. Which pharmacy/location (including street and city if local pharmacy) is medication to be sent to?  [id number] [place], [place] - [address]     5. Do they need a 30 day or 90 day supply? 90 day

## 2024-05-28 NOTE — Progress Notes (Signed)
 HPI: FU nonischemic cardiomyopathy. In 2005 the patient had a cardiac catheterization that showed nonobstructive coronary disease and an ejection fraction of 47%. Cardiac MRI showed no RV dysplasia; EF 37. He has had problems with ventricular ectopy (question if cardiomyopathy caused his ectopy or ectopy caused his cardiomyopathy). Patient had exercise treadmill that showed no exercise induced ventricular arrhythmias. Patient seen by Dr. 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 Patient also states he was seen at [facility] and had an electrophysiology study for which I do not have the records. He did not have ablation. Holter monitor in June 2014 showed sinus rhythm with frequent PVCs and occasional couplets. Last echocardiogram May 2016 showed ejection fraction 50-55% and mild left atrial enlargement.  Echocardiogram March 2024 showed ejection fraction 45 to 50%, grade 1 diastolic dysfunction and trace aortic insufficiency.  Since last seen, the patient denies any dyspnea on exertion, orthopnea, PND, pedal edema, palpitations, syncope or chest pain.   Current Outpatient Medications  Medication Sig Dispense Refill   carvedilol  (COREG ) 12.5 MG tablet Take 1 tablet (12.5 mg total) by mouth 2 (two) times daily with a meal. 180 tablet 0   No current facility-administered medications for this visit.     Past Medical History:  Diagnosis Date   Other premature beats    Secondary cardiomyopathy, unspecified     History reviewed. No pertinent surgical history.  Social History   Socioeconomic History   Marital status: Married    Spouse name: Not on file   Number of children: 2   Years of education: Not on file   Highest education level: Not on file  Occupational History    Comment: Theatre stage manager  Tobacco Use   Smoking status: Never   Smokeless tobacco: Never   Tobacco comments:    socially   Substance and Sexual Activity   Alcohol use: Yes    Alcohol/week: 0.0 standard  drinks of alcohol    Comment: socially    Drug use: No   Sexual activity: Not on file  Other Topics Concern   Not on file  Social History Narrative   Full time. Regularly exercises.    Social Drivers of Corporate investment banker Strain: Not on file  Food Insecurity: Not on file  Transportation Needs: Not on file  Physical Activity: Not on file  Stress: Not on file  Social Connections: Unknown (03/17/2022)   Received from [facility]   Social Network    Social Network: Not on file  Intimate Partner Violence: Unknown (02/10/2022)   Received from [facility]   HITS    Physically Hurt: Not on file    Insult or Talk Down To: Not on file    Threaten Physical Harm: Not on file    Scream or Curse: Not on file    Family History  Problem Relation Age of Onset   CAD Father        MI at age 47   Diabetes type II Mother    Heart failure Other     ROS: no fevers or chills, productive cough, hemoptysis, dysphasia, odynophagia, melena, hematochezia, dysuria, hematuria, rash, seizure activity, orthopnea, PND, pedal edema, claudication. Remaining systems are negative.  Physical Exam: Well-developed well-nourished in no acute distress.  Skin is warm and dry.  HEENT is normal.  Neck is supple.  Chest is clear to auscultation with normal expansion.  Cardiovascular exam is regular rate and rhythm.  Abdominal exam nontender or distended. No  masses palpated. Extremities show no edema. neuro grossly intact  EKG Interpretation Date/Time:  Monday June 04 2024 09:55:40 EDT Ventricular Rate:  61 PR Interval:  164 QRS Duration:  84 QT Interval:  374 QTC Calculation: 376 R Axis:   16  Text Interpretation: Normal sinus rhythm Normal ECG Confirmed by Pietro Rogue ([id number]) on 06/04/2024 9:56:29 AM    A/P  1 history of nonischemic cardiomyopathy-LV function mildly reduced on most recent study.  Possibly secondary to ventricular ectopy.  Continue beta-blocker.  Repeat  echocardiogram.  2 history of PVCs-continue beta-blocker.  3 elevated blood pressure reading-patient's blood pressure is mildly elevated today but he follows this at home and it is typically controlled.  We will follow and adjust as needed.  Rogue Pietro, MD

## 2024-06-04 ENCOUNTER — Ambulatory Visit: Attending: Cardiology | Admitting: Cardiology

## 2024-06-04 ENCOUNTER — Encounter: Payer: Self-pay | Admitting: Cardiology

## 2024-06-04 VITALS — BP 148/91 | HR 61 | Ht 69.0 in | Wt 195.2 lb

## 2024-06-04 DIAGNOSIS — I493 Ventricular premature depolarization: Secondary | ICD-10-CM

## 2024-06-04 DIAGNOSIS — I428 Other cardiomyopathies: Secondary | ICD-10-CM | POA: Diagnosis not present

## 2024-06-04 DIAGNOSIS — I420 Dilated cardiomyopathy: Secondary | ICD-10-CM

## 2024-06-04 NOTE — Patient Instructions (Signed)
  Testing/Procedures:  Your physician has requested that you have an echocardiogram. Echocardiography is a painless test that uses sound waves to create images of your heart. It provides your doctor with information about the size and shape of your heart and how well your heart's chambers and valves are working. This procedure takes approximately one hour. There are no restrictions for this procedure. Please do NOT wear cologne, perfume, aftershave, or lotions (deodorant is allowed). Please arrive 15 minutes prior to your appointment time.  Please note: We ask at that you not bring children with you during ultrasound (echo/ vascular) testing. Due to room size and safety concerns, children are not allowed in the ultrasound rooms during exams. Our front office staff cannot provide observation of children in our lobby area while testing is being conducted. An adult accompanying a patient to their appointment will only be allowed in the ultrasound room at the discretion of the ultrasound technician under special circumstances. We apologize for any inconvenience. [address]  Follow-Up: At [facility], you and your health needs are our priority.  As part of our continuing mission to provide you with exceptional heart care, our providers are all part of one team.  This team includes your primary Cardiologist (physician) and Advanced Practice Providers or APPs (Physician Assistants and Nurse Practitioners) who all work together to provide you with the care you need, when you need it.  Your next appointment:   12 month(s)  Provider:   Alexandria Angel MD

## 2024-06-15 ENCOUNTER — Other Ambulatory Visit: Payer: Self-pay | Admitting: Cardiology

## 2024-07-10 ENCOUNTER — Ambulatory Visit (HOSPITAL_COMMUNITY)
Admission: RE | Admit: 2024-07-10 | Discharge: 2024-07-10 | Disposition: A | Discharge status: Home / Self Care | Source: Ambulatory Visit | Attending: Cardiology | Admitting: Cardiology

## 2024-07-10 ENCOUNTER — Ambulatory Visit: Payer: Self-pay | Admitting: Cardiology

## 2024-07-10 DIAGNOSIS — I420 Dilated cardiomyopathy: Secondary | ICD-10-CM | POA: Diagnosis present

## 2024-07-10 DIAGNOSIS — I428 Other cardiomyopathies: Secondary | ICD-10-CM | POA: Diagnosis present

## 2024-07-10 LAB — ECHOCARDIOGRAM COMPLETE
Area-P 1/2: 3.85 cm2
S' Lateral: 3.5 cm
